# Patient Record
Sex: Female | Born: 1975 | Race: Black or African American | Hispanic: No | Marital: Married | Smoking: Never smoker
Health system: Southern US, Community
[De-identification: ages and names within clinical notes are randomized; demographics above are authoritative.]

## PROBLEM LIST (undated history)

## (undated) HISTORY — PX: WISDOM TOOTH EXTRACTION: SHX21

## (undated) HISTORY — PX: NO PAST SURGERIES: SHX2092

---

## 1999-12-22 ENCOUNTER — Other Ambulatory Visit: Admission: RE | Admit: 1999-12-22 | Discharge: 1999-12-22 | Payer: Self-pay | Admitting: Obstetrics and Gynecology

## 2000-01-19 ENCOUNTER — Encounter (INDEPENDENT_AMBULATORY_CARE_PROVIDER_SITE_OTHER): Payer: Self-pay

## 2000-01-19 ENCOUNTER — Other Ambulatory Visit: Admission: RE | Admit: 2000-01-19 | Discharge: 2000-01-19 | Payer: Self-pay | Admitting: Obstetrics and Gynecology

## 2001-03-10 ENCOUNTER — Other Ambulatory Visit: Admission: RE | Admit: 2001-03-10 | Discharge: 2001-03-10 | Payer: Self-pay | Admitting: Obstetrics and Gynecology

## 2006-07-26 ENCOUNTER — Encounter: Admission: RE | Admit: 2006-07-26 | Discharge: 2006-07-26 | Payer: Self-pay | Admitting: Obstetrics and Gynecology

## 2007-04-03 ENCOUNTER — Encounter: Admission: RE | Admit: 2007-04-03 | Discharge: 2007-04-03 | Payer: Self-pay | Admitting: Obstetrics and Gynecology

## 2008-02-09 ENCOUNTER — Encounter: Admission: RE | Admit: 2008-02-09 | Discharge: 2008-02-09 | Payer: Self-pay | Admitting: Obstetrics and Gynecology

## 2008-04-28 ENCOUNTER — Encounter (INDEPENDENT_AMBULATORY_CARE_PROVIDER_SITE_OTHER): Payer: Self-pay | Admitting: Obstetrics and Gynecology

## 2008-04-28 ENCOUNTER — Ambulatory Visit (HOSPITAL_COMMUNITY): Admission: RE | Admit: 2008-04-28 | Discharge: 2008-04-28 | Payer: Self-pay | Admitting: Obstetrics and Gynecology

## 2009-01-17 ENCOUNTER — Encounter: Admission: RE | Admit: 2009-01-17 | Discharge: 2009-01-17 | Payer: Self-pay | Admitting: Obstetrics and Gynecology

## 2010-01-31 ENCOUNTER — Encounter: Admission: RE | Admit: 2010-01-31 | Discharge: 2010-01-31 | Payer: Self-pay | Admitting: Obstetrics and Gynecology

## 2010-09-24 ENCOUNTER — Encounter: Payer: Self-pay | Admitting: Obstetrics and Gynecology

## 2011-01-16 NOTE — Op Note (Signed)
Sabrina Wilkinson, Sabrina Wilkinson            ACCOUNT NO.:  [id number]   MEDICAL RECORD NO.:  [id number]          PATIENT TYPE:  AMB   LOCATION:  SDC                           FACILITY:  [facility]   PHYSICIAN:  Randye Lobo, M.D.   DATE OF BIRTH:  Aug 04, 1976   DATE OF PROCEDURE:  04/28/2008  DATE OF DISCHARGE:                               OPERATIVE REPORT   PREOPERATIVE DIAGNOSES:  Displaced intrauterine device, abnormal uterine  bleeding.   POSTOPERATIVE DIAGNOSES:  Displaced intrauterine device, abnormal  uterine bleeding.   PROCEDURE:  Retrieval of IUD, hysteroscopy, and D&C.   SURGEON:  Randye Lobo, MD.   ANESTHESIA:  MAC, paracervical block with 1% lidocaine plain.   IV FLUIDS:  1700 mL Ringer's lactate.   ESTIMATED BLOOD LOSS:  Minimal.   URINE OUTPUT:  By I and O catheterization prior to procedure, sorbitol  deficit is 10 mL.   COMPLICATIONS:  None.   INDICATIONS FOR THE PROCEDURE:  The patient is a 35 year old gravida 2,  para 2 African American female who presented with irregular  menstruation.  The patient had an IUD placed in [place] approximately 2  years prior and had been doing well up until this point.  In previous  office visits, the IUD strings were not visible in the vagina and the  patient has had a prior ultrasound documenting the presence of the IUD  in the endometrial canal.  When the patient presented with bleeding  problems, again the IUD strings were not visible.  The patient did  undergo a Pap smear along with GC and chlamydia cultures, and these were  all normal.  An ultrasound was performed, which documented that the IUD  was in the upper cervical canal and not in the endometrial canal.  An  attempt was made to remove the IUD in an office setting, but this was  not possible.  Our plan is therefore now to proceed with the retrieval  of the IUD along with dilation of the cervix and a possible curettage of  the endometrium along with hysteroscopic assistance  if necessary.  Risks, benefits, and alternatives were reviewed with the patient who  wishes to proceed.  The patient has definitely requested IUD removal as  she wishes for another form of contraception.   SPECIMEN:  Endometrial curettings were sent to pathology.   PROCEDURE:  The patient was reidentified in the preoperative hold area.  She received Ancef 1 g IV for antibiotic prophylaxis.  The patient was  noted to have a negative pregnancy test.   In the operating room, the patient was placed in the supine position and  an LMA anesthetic was induced.  She was then placed in the dorsal  lithotomy position.  The vagina and perineum were sterilely prepped and  draped and the bladder was catheterized of urine.   An exam under anesthesia was performed.  The uterus was noted to be  small and anteverted and there were no adnexal masses.  On speculum  exam, no IUD strings were visible.   The uterus was sounded at this point to 7 cm.  The  cervix was then  sequentially dilated to a #21 Pratt dilator.  A polyp forceps was  introduced to the top of the cervical canal and the IUD could be grasped  at this time and was removed in entirety.  It was discarded.   Due to the patient's irregular bleeding, a decision was made to proceed  also with a hysteroscopy and endometrial curettage.  The diagnostic  hysteroscope was therefore inserted through the cervical os under the  continuous infusion of sorbitol solution.  The endometrial cavity was  visualized and the endometrium appeared to be somewhat thickened,  although there were no discrete polyps or fibroids appreciated.  The  hysteroscope was therefore withdrawn and gentle curettage in all 4  quadrants was performed with a serrated curette and the endometrial  curettings were sent to pathology.   All of the vaginal instruments were removed.  The hemostasis was noted  to be good.   The patient was cleansed with Betadine and escorted to the  recovery room  in stable and awake condition.  There were no complications to the  procedure.  All needle, instrument, and sponge counts were correct.      Randye Lobo, M.D.  Electronically Signed     BES/MEDQ  D:  04/28/2008  T:  04/29/2008  Job:  [id number]

## 2013-01-28 ENCOUNTER — Other Ambulatory Visit: Payer: Self-pay

## 2013-01-28 DIAGNOSIS — Z1231 Encounter for screening mammogram for malignant neoplasm of breast: Secondary | ICD-10-CM

## 2013-02-13 ENCOUNTER — Other Ambulatory Visit: Payer: Self-pay

## 2013-02-27 ENCOUNTER — Ambulatory Visit
Admission: RE | Admit: 2013-02-27 | Discharge: 2013-02-27 | Disposition: A | Discharge status: Home / Self Care | Payer: 59 | Source: Ambulatory Visit

## 2013-02-27 DIAGNOSIS — Z1231 Encounter for screening mammogram for malignant neoplasm of breast: Secondary | ICD-10-CM

## 2013-03-03 ENCOUNTER — Other Ambulatory Visit: Payer: Self-pay | Admitting: Obstetrics and Gynecology

## 2013-03-03 DIAGNOSIS — R928 Other abnormal and inconclusive findings on diagnostic imaging of breast: Secondary | ICD-10-CM

## 2013-03-17 ENCOUNTER — Ambulatory Visit
Admission: RE | Admit: 2013-03-17 | Discharge: 2013-03-17 | Disposition: A | Discharge status: Home / Self Care | Payer: 59 | Source: Ambulatory Visit | Attending: Obstetrics and Gynecology | Admitting: Obstetrics and Gynecology

## 2013-03-17 DIAGNOSIS — R928 Other abnormal and inconclusive findings on diagnostic imaging of breast: Secondary | ICD-10-CM

## 2014-02-10 ENCOUNTER — Other Ambulatory Visit: Payer: Self-pay

## 2014-02-10 DIAGNOSIS — Z1231 Encounter for screening mammogram for malignant neoplasm of breast: Secondary | ICD-10-CM

## 2014-03-12 ENCOUNTER — Ambulatory Visit
Admission: RE | Admit: 2014-03-12 | Discharge: 2014-03-12 | Disposition: A | Discharge status: Home / Self Care | Payer: Private Health Insurance - Indemnity | Source: Ambulatory Visit

## 2014-03-12 ENCOUNTER — Encounter (INDEPENDENT_AMBULATORY_CARE_PROVIDER_SITE_OTHER): Payer: Self-pay

## 2014-03-12 DIAGNOSIS — Z1231 Encounter for screening mammogram for malignant neoplasm of breast: Secondary | ICD-10-CM

## 2014-04-05 ENCOUNTER — Emergency Department (HOSPITAL_COMMUNITY): Payer: Private Health Insurance - Indemnity

## 2014-04-05 ENCOUNTER — Emergency Department (HOSPITAL_COMMUNITY)
Admission: EM | Admit: 2014-04-05 | Discharge: 2014-04-05 | Disposition: A | Discharge status: Home / Self Care | Payer: Private Health Insurance - Indemnity | Attending: Emergency Medicine | Admitting: Emergency Medicine

## 2014-04-05 ENCOUNTER — Encounter (HOSPITAL_COMMUNITY): Payer: Self-pay | Admitting: Emergency Medicine

## 2014-04-05 DIAGNOSIS — R42 Dizziness and giddiness: Secondary | ICD-10-CM | POA: Insufficient documentation

## 2014-04-05 DIAGNOSIS — H8190 Unspecified disorder of vestibular function, unspecified ear: Secondary | ICD-10-CM

## 2014-04-05 LAB — COMPREHENSIVE METABOLIC PANEL
ALT: 16 U/L (ref 0–35)
AST: 19 U/L (ref 0–37)
Albumin: 3.9 g/dL (ref 3.5–5.2)
Alkaline Phosphatase: 64 U/L (ref 39–117)
Anion gap: 14 (ref 5–15)
BUN: 13 mg/dL (ref 6–23)
CALCIUM: 9.2 mg/dL (ref 8.4–10.5)
CO2: 24 mEq/L (ref 19–32)
CREATININE: 0.79 mg/dL (ref 0.50–1.10)
Chloride: 102 mEq/L (ref 96–112)
GFR calc Af Amer: >90 mL/min (ref >90)
GLUCOSE: 117 mg/dL — AB (ref 70–99)
Potassium: 3.6 mEq/L — ABNORMAL LOW (ref 3.7–5.3)
Sodium: 140 mEq/L (ref 137–147)
Total Bilirubin: 0.3 mg/dL (ref 0.3–1.2)
Total Protein: 7.7 g/dL (ref 6.0–8.3)

## 2014-04-05 LAB — CBC
HEMATOCRIT: 43.7 % (ref 36.0–46.0)
Hemoglobin: 14.5 g/dL (ref 12.0–15.0)
MCH: 31.6 pg (ref 26.0–34.0)
MCHC: 33.2 g/dL (ref 30.0–36.0)
MCV: 95.2 fL (ref 78.0–100.0)
Platelets: 271 10*3/uL (ref 150–400)
RBC: 4.59 MIL/uL (ref 3.87–5.11)
RDW: 13.8 % (ref 11.5–15.5)
WBC: 7 10*3/uL (ref 4.0–10.5)

## 2014-04-05 LAB — DIFFERENTIAL
Basophils Absolute: 0 10*3/uL (ref 0.0–0.1)
Basophils Relative: 0 % (ref 0–1)
EOS PCT: 1 % (ref 0–5)
Eosinophils Absolute: 0 10*3/uL (ref 0.0–0.7)
LYMPHS ABS: 1.6 10*3/uL (ref 0.7–4.0)
Lymphocytes Relative: 23 % (ref 12–46)
MONO ABS: 0.3 10*3/uL (ref 0.1–1.0)
Monocytes Relative: 5 % (ref 3–12)
Neutro Abs: 5 10*3/uL (ref 1.7–7.7)
Neutrophils Relative %: 71 % (ref 43–77)

## 2014-04-05 LAB — I-STAT TROPONIN, ED: TROPONIN I, POC: 0 ng/mL (ref 0.00–0.08)

## 2014-04-05 LAB — I-STAT CHEM 8, ED
BUN: 13 mg/dL (ref 6–23)
CALCIUM ION: 1.15 mmol/L (ref 1.12–1.23)
Chloride: 106 mEq/L (ref 96–112)
Creatinine, Ser: 0.8 mg/dL (ref 0.50–1.10)
Glucose, Bld: 120 mg/dL — ABNORMAL HIGH (ref 70–99)
HCT: 49 % — ABNORMAL HIGH (ref 36.0–46.0)
Hemoglobin: 16.7 g/dL — ABNORMAL HIGH (ref 12.0–15.0)
Potassium: 3.6 mEq/L — ABNORMAL LOW (ref 3.7–5.3)
Sodium: 140 mEq/L (ref 137–147)
TCO2: 24 mmol/L (ref 0–100)

## 2014-04-05 LAB — PROTIME-INR
INR: 1.01 (ref 0.00–1.49)
Prothrombin Time: 13.3 seconds (ref 11.6–15.2)

## 2014-04-05 LAB — APTT: aPTT: 31 seconds (ref 24–37)

## 2014-04-05 MED ORDER — MECLIZINE HCL 50 MG PO TABS: 50.0000 mg | ORAL_TABLET | Freq: Three times a day (TID) | ORAL | Status: DC | PRN

## 2014-04-05 NOTE — ED Provider Notes (Signed)
CSN: [id number]     Arrival date & time 04/05/14  0615 History   First MD Initiated Contact with Patient 04/05/14 [phone]     Chief Complaint  Patient presents with  . Dizziness  . Nausea     (Consider location/radiation/quality/duration/timing/severity/associated sxs/prior Treatment) HPI  Sabrina Wilkinson is a 38 y.o. female here for evaluation of intermittent episodes of vertigo. She describes a spinning sensation that causes her to feel off balance, which occurs spontaneously, and resolves within a few seconds; onset symptoms yesterday. There is no associated headache, weakness, paresthesias, fever, chills, cough, chest pain or shortness of breath. She has been having some mild sneezing recently. She's never had this previously. There are no other known modifying factors.   History reviewed. No pertinent past medical history. History reviewed. No pertinent past surgical history. No family history on file. History  Substance Use Topics  . Smoking status: Never Smoker   . Smokeless tobacco: Not on file  . Alcohol Use: Yes   OB History   Grav Para Term Preterm Abortions TAB SAB Ect Mult Living                 Review of Systems  All other systems reviewed and are negative.     Allergies  Review of patient's allergies indicates no known allergies.  Home Medications   Prior to Admission medications   Not on File   BP 149/93  Pulse 74  Temp(Src) 97.7 F (36.5 C) (Oral)  Resp 18  SpO2 100%  LMP 04/04/2014 Physical Exam  Nursing note and vitals reviewed. Constitutional: She is oriented to person, place, and time. She appears well-developed and well-nourished.  HENT:  Head: Normocephalic and atraumatic.  Eyes: Conjunctivae and EOM are normal. Pupils are equal, round, and reactive to light.  Neck: Normal range of motion and phonation normal. Neck supple.  Cardiovascular: Normal rate, regular rhythm and intact distal pulses.   Pulmonary/Chest: Effort normal and breath  sounds normal. She exhibits no tenderness.  Abdominal: Soft. She exhibits no distension. There is no tenderness. There is no guarding.  Musculoskeletal: Normal range of motion.  Neurological: She is alert and oriented to person, place, and time. She exhibits normal muscle tone.  No dysarthria, aphasia or nystagmus  Skin: Skin is warm and dry.  Psychiatric: She has a normal mood and affect. Her behavior is normal. Judgment and thought content normal.    ED Course  Procedures (including critical care time)   Findings discussed with patient and husband. All questions answered.   Labs Review Labs Reviewed  I-STAT CHEM 8, ED - Abnormal; Notable for the following:    Potassium 3.6 (*)    Glucose, Bld 120 (*)    Hemoglobin 16.7 (*)    HCT 49.0 (*)    All other components within normal limits  PROTIME-INR  APTT  CBC  DIFFERENTIAL  COMPREHENSIVE METABOLIC PANEL  I-STAT TROPOININ, ED  POC URINE PREG, ED    Imaging Review Ct Head (brain) Wo Contrast  04/05/2014   CLINICAL DATA:  Dizziness started 2 p.m. yesterday. Nausea. No headache or body weakness.  EXAM: CT HEAD WITHOUT CONTRAST  TECHNIQUE: Contiguous axial images were obtained from the base of the skull through the vertex without intravenous contrast.  COMPARISON:  None.  FINDINGS: No intracranial hemorrhage.  No hydrocephalus.  No intracranial mass lesion noted on this unenhanced exam.  Vessel at the M1 segment of the right middle cerebral artery may represent vein rather than hyperdense  right middle cerebral artery. If right hemispheric infarct is of high clinical concern, MR imaging may prove helpful for further delineation.  No CT evidence of posterior fossa infarct however given the patient's symptoms, if this were of high clinical concern, MR would prove more sensitive for detection of small acute posterior fossa infarct.  Mastoid air cells, middle ear cavities and visualized paranasal sinuses are clear. No osseous destructive  lesion.  IMPRESSION: No intracranial hemorrhage.  No definitive CT evidence of large acute infarct. Follow-up as discussed above.  These results were called by telephone at the time of interpretation on 04/05/2014 at 7:32 am to Dr. Nicanor AlconPalumbo, who verbally acknowledged these results.   Electronically Signed   By: Bridgett LarssonSteve  Olson M.D.   On: 04/05/2014 07:33     EKG Interpretation   Date/Time:  Monday April 05 2014 06:27:53 EDT Ventricular Rate:  65 PR Interval:  156 QRS Duration: 80 QT Interval:  410 QTC Calculation: 426 R Axis:   35 Text Interpretation:  Normal sinus rhythm Nonspecific T wave abnormality  Abnormal ECG No old tracing to compare Confirmed by [facility]  MD, Ilze Roselli  [phone]) on 04/05/2014 7:33:58 AM      MDM   Final diagnoses:  Vestibular dizziness, unspecified laterality    Evidence of vertigo from likely middle ear source. No evidence for central vertigo.   Nursing Notes Reviewed/ Care Coordinated Applicable Imaging Reviewed Interpretation of Laboratory Data incorporated into ED treatment  The patient appears reasonably screened and/or stabilized for discharge and I doubt any other medical condition or other [facility] requiring further screening, evaluation, or treatment in the ED at this time prior to discharge.  Plan: Home Medications- Meclizine; Home Treatments- rest; return here if the recommended treatment, does not improve the symptoms; Recommended follow up- PCP prn    Flint MelterElliott L Jaclyn Carew, MD 04/05/14 1701

## 2014-04-05 NOTE — ED Notes (Signed)
Reports sudden onset of dizziness yesterday that was followed by nausea and vomiting.  Reports vomited x 3.

## 2014-04-05 NOTE — ED Notes (Signed)
Patient transported to CT 

## 2014-04-05 NOTE — Discharge Instructions (Signed)

## 2015-06-07 ENCOUNTER — Encounter (HOSPITAL_COMMUNITY): Payer: Self-pay | Admitting: Emergency Medicine

## 2015-06-07 DIAGNOSIS — R109 Unspecified abdominal pain: Secondary | ICD-10-CM | POA: Diagnosis present

## 2015-06-07 DIAGNOSIS — R1013 Epigastric pain: Secondary | ICD-10-CM | POA: Insufficient documentation

## 2015-06-07 LAB — COMPREHENSIVE METABOLIC PANEL
ALK PHOS: 62 U/L (ref 38–126)
ALT: 29 U/L (ref 14–54)
AST: 67 U/L — ABNORMAL HIGH (ref 15–41)
Albumin: 3.9 g/dL (ref 3.5–5.0)
Anion gap: 9 (ref 5–15)
BUN: 11 mg/dL (ref 6–20)
CHLORIDE: 106 mmol/L (ref 101–111)
CO2: 22 mmol/L (ref 22–32)
CREATININE: 0.74 mg/dL (ref 0.44–1.00)
Calcium: 9.2 mg/dL (ref 8.9–10.3)
Glucose, Bld: 113 mg/dL — ABNORMAL HIGH (ref 65–99)
Potassium: 3.5 mmol/L (ref 3.5–5.1)
SODIUM: 137 mmol/L (ref 135–145)
Total Bilirubin: 0.8 mg/dL (ref 0.3–1.2)
Total Protein: 7.3 g/dL (ref 6.5–8.1)

## 2015-06-07 LAB — CBC WITH DIFFERENTIAL/PLATELET
Basophils Absolute: 0 10*3/uL (ref 0.0–0.1)
Basophils Relative: 0 %
Eosinophils Absolute: 0.1 10*3/uL (ref 0.0–0.7)
Eosinophils Relative: 1 %
HCT: 42.3 % (ref 36.0–46.0)
HEMOGLOBIN: 13.8 g/dL (ref 12.0–15.0)
LYMPHS ABS: 4.1 10*3/uL — AB (ref 0.7–4.0)
LYMPHS PCT: 40 %
MCH: 31.2 pg (ref 26.0–34.0)
MCHC: 32.6 g/dL (ref 30.0–36.0)
MCV: 95.5 fL (ref 78.0–100.0)
Monocytes Absolute: 0.7 10*3/uL (ref 0.1–1.0)
Monocytes Relative: 7 %
NEUTROS PCT: 52 %
Neutro Abs: 5.4 10*3/uL (ref 1.7–7.7)
Platelets: 289 10*3/uL (ref 150–400)
RBC: 4.43 MIL/uL (ref 3.87–5.11)
RDW: 13.2 % (ref 11.5–15.5)
WBC: 10.4 10*3/uL (ref 4.0–10.5)

## 2015-06-07 NOTE — ED Notes (Signed)
Pt c/o epigastric pain onset approx 1 hour ago.  Pt denies any nausea or vomiting.  Pt st's she had pizza for dinner. St's she tried tums without relief

## 2015-06-08 ENCOUNTER — Emergency Department (HOSPITAL_COMMUNITY)
Admission: EM | Admit: 2015-06-08 | Discharge: 2015-06-08 | Discharge status: Against Medical Advice | Payer: 59 | Attending: Emergency Medicine | Admitting: Emergency Medicine

## 2015-06-08 NOTE — ED Notes (Signed)
Unable to locate for room x3 

## 2015-06-19 IMAGING — MG MM SCREEN MAMMOGRAM BILATERAL
4 series · 4 of 4 positions shown · non-contrast
Comparison: Previous exam(s).

CLINICAL DATA: Screening.

EXAM:
DIGITAL SCREENING BILATERAL MAMMOGRAM WITH CAD

[R CC]
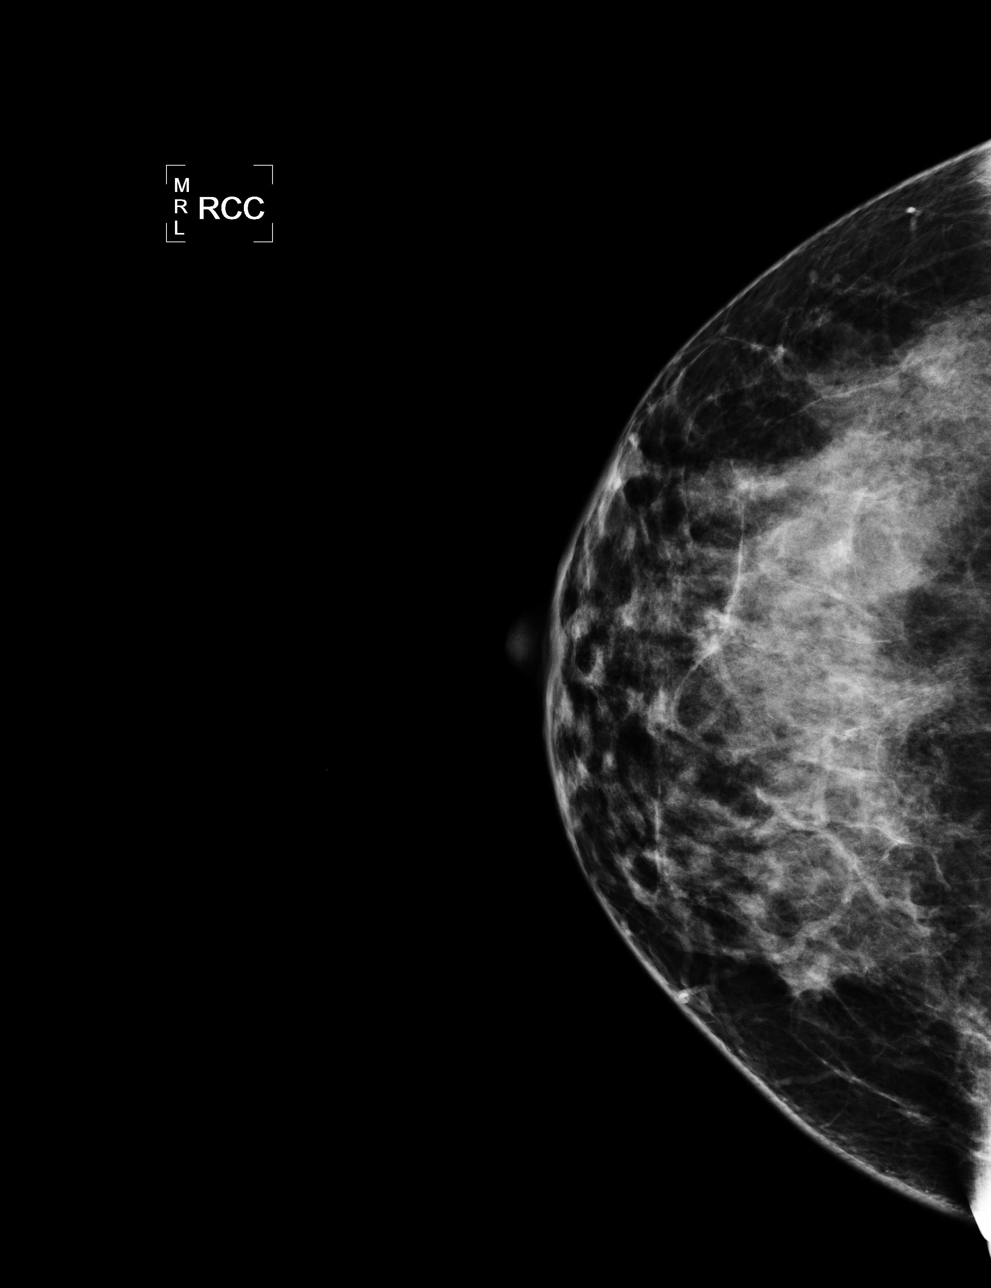

[L CC]
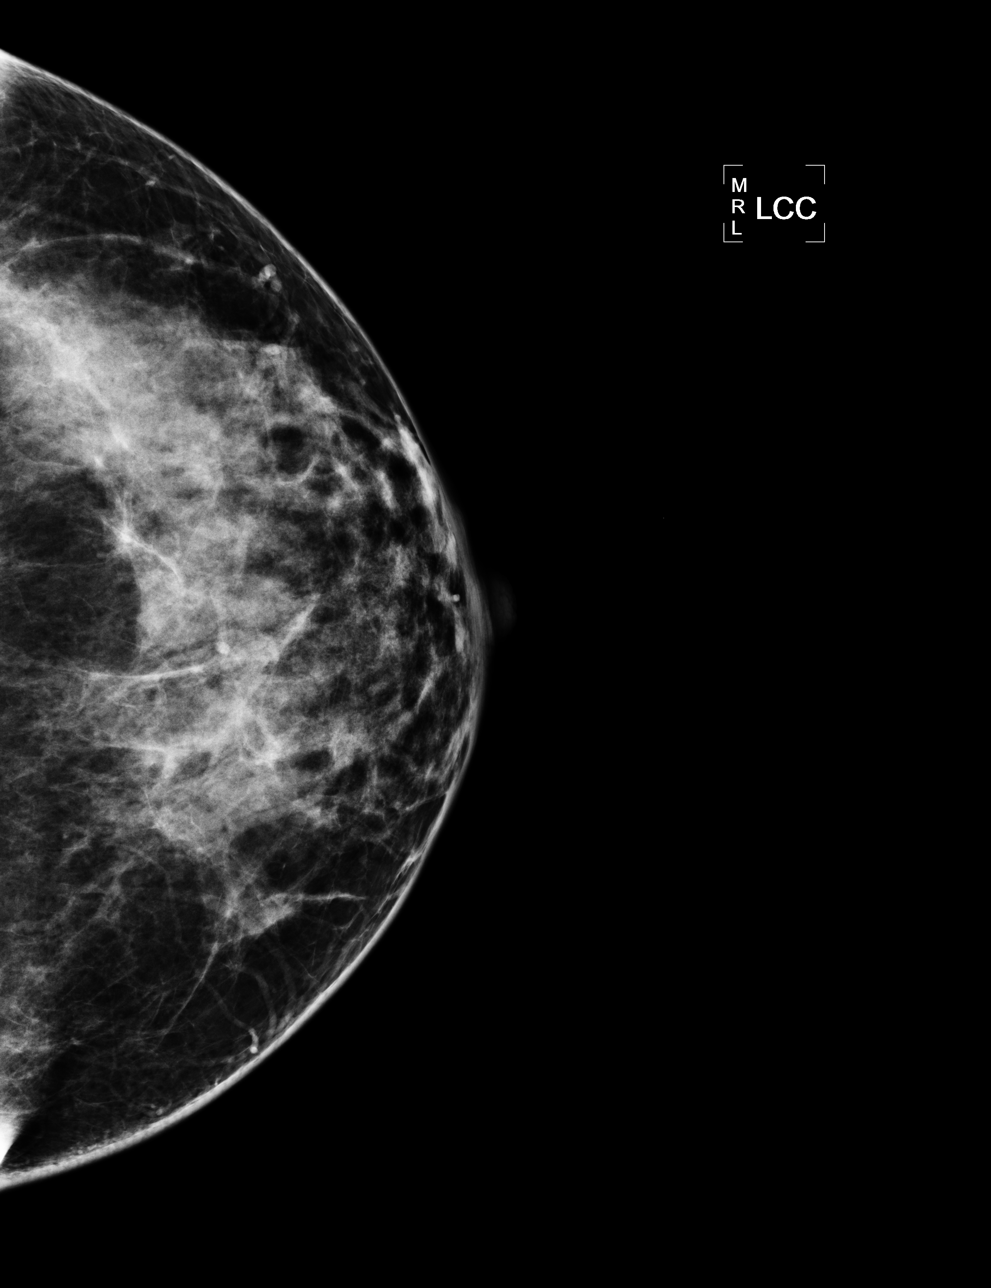

[L MLO]
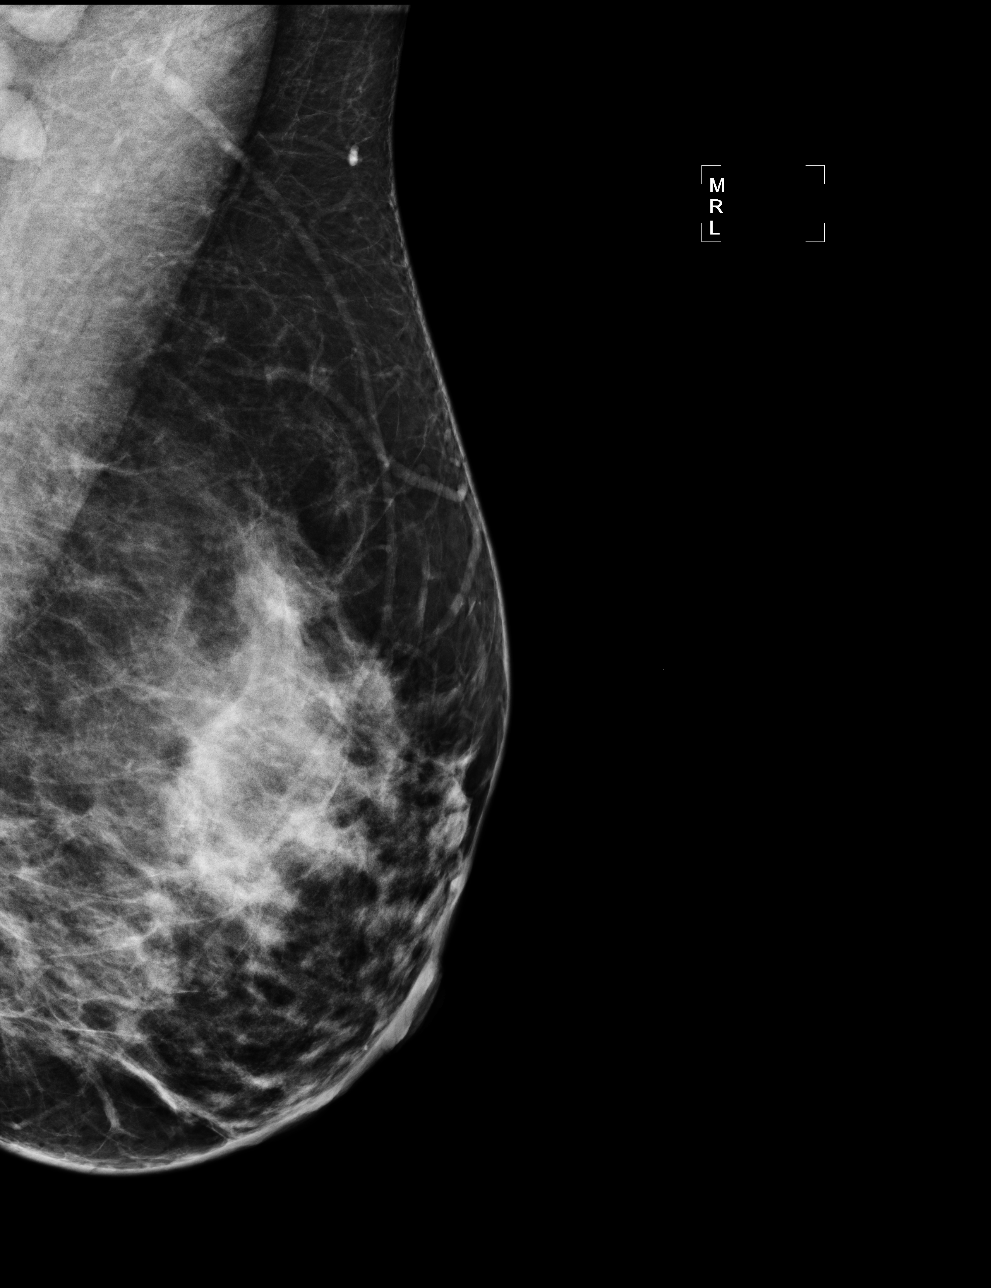

[R MLO]
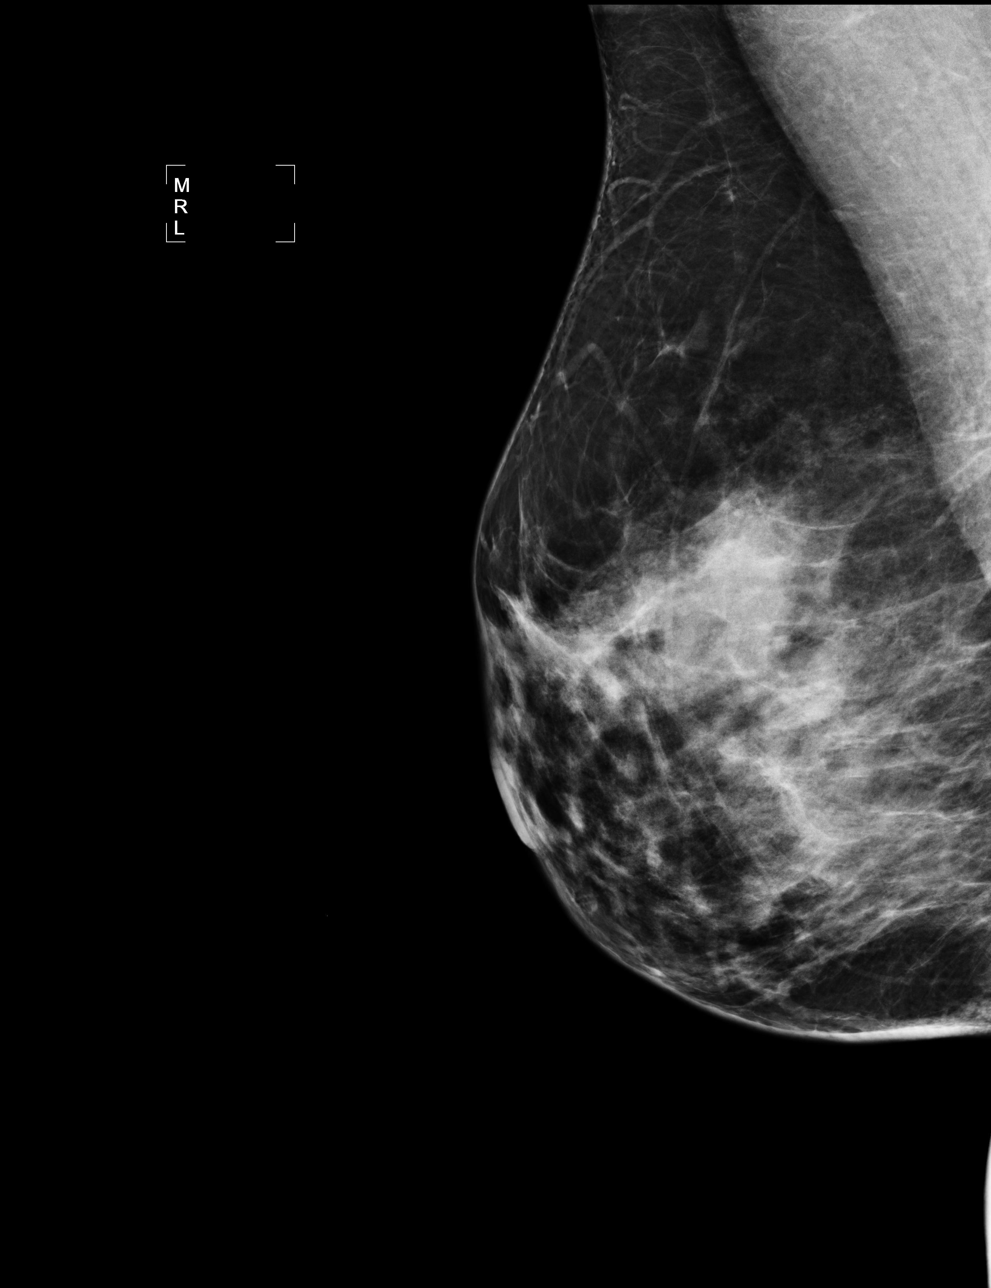

[4 of 4 positions shown; findings below may reference images not displayed]

ACR Breast Density Category c: The breast tissue is heterogeneously
dense, which may obscure small masses.
FINDINGS: There are no findings suspicious for malignancy. Images were
processed with CAD.
IMPRESSION: No mammographic evidence of malignancy. A result letter of this
screening mammogram will be mailed directly to the patient.

RECOMMENDATION:
Screening mammogram in one year. (Code:YJ-2-FEZ)

BI-RADS CATEGORY  1: Negative.

## 2015-07-13 IMAGING — CT CT HEAD W/O CM
1 series · 15 of 30 positions shown, 19 images · non-contrast
Comparison: None.

CLINICAL DATA: Dizziness started 2 p.m. yesterday. Nausea. No
headache or body weakness.

EXAM:
CT HEAD WITHOUT CONTRAST
TECHNIQUE: Contiguous axial images were obtained from the base of the skull
through the vertex without intravenous contrast.

[Series 2: head 5.0 h30s · axial · 0.46mm/px · z∈[-84,+51]mm · 15 of 30 slices shown, 19 images]
[im 2/30  brain]
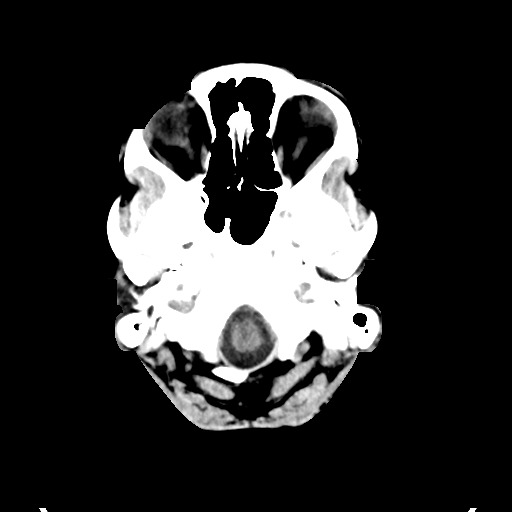
[im 2/30  bone]
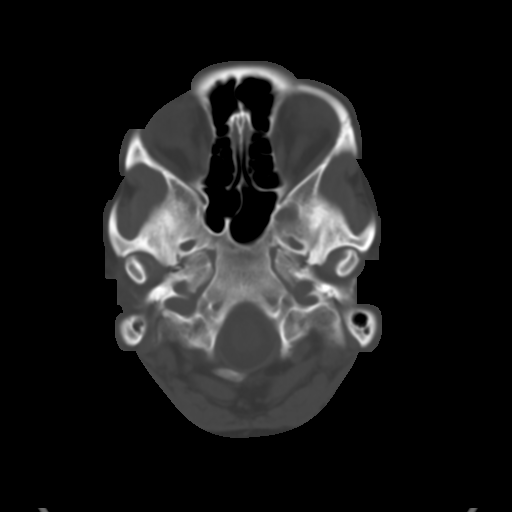
[im 4/30  brain]
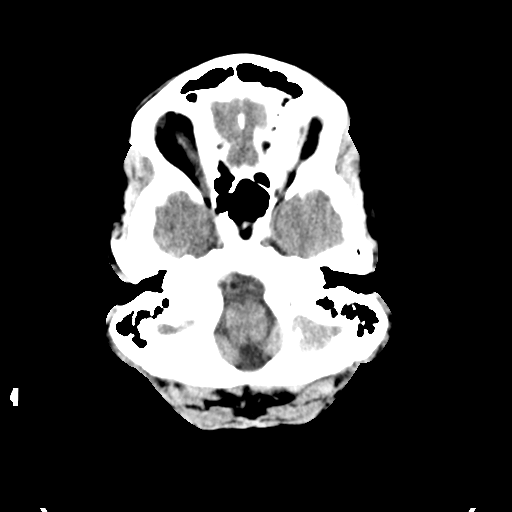
[im 6/30  brain]
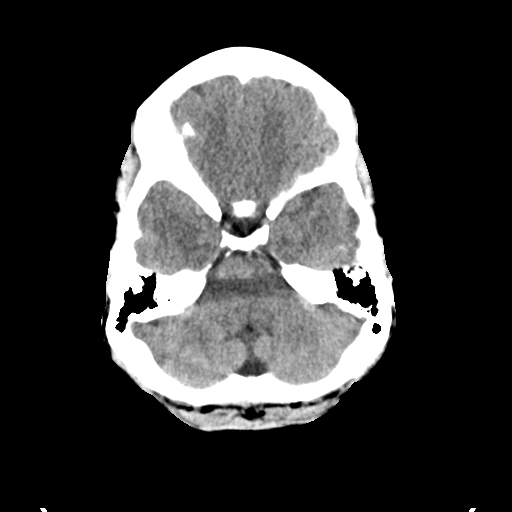
[im 8/30  brain]
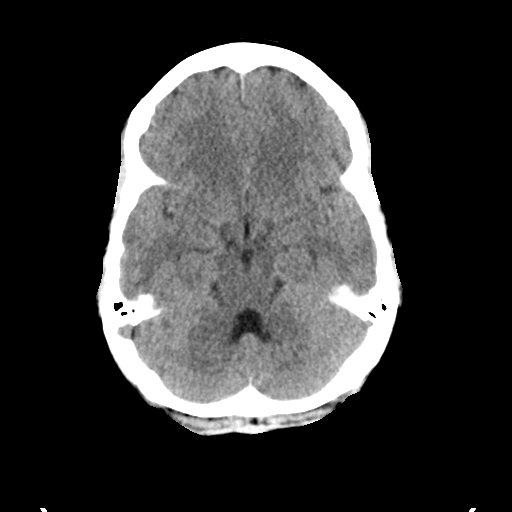
[im 10/30  brain]
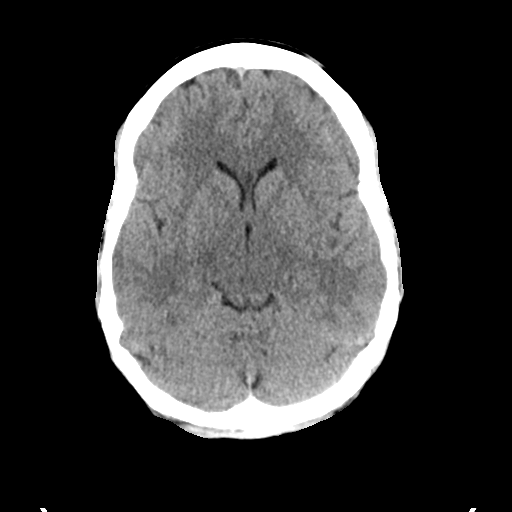
[im 10/30  bone]
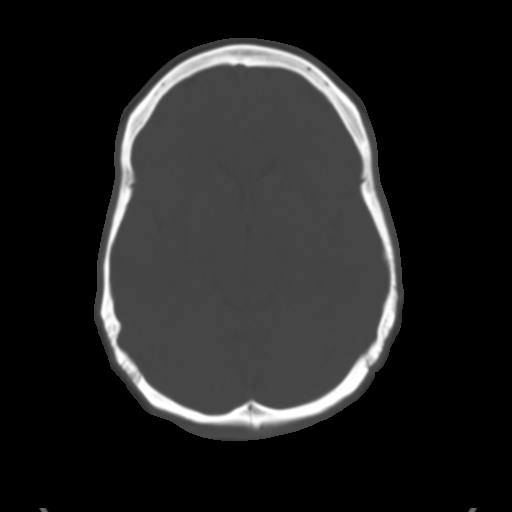
[im 12/30  brain]
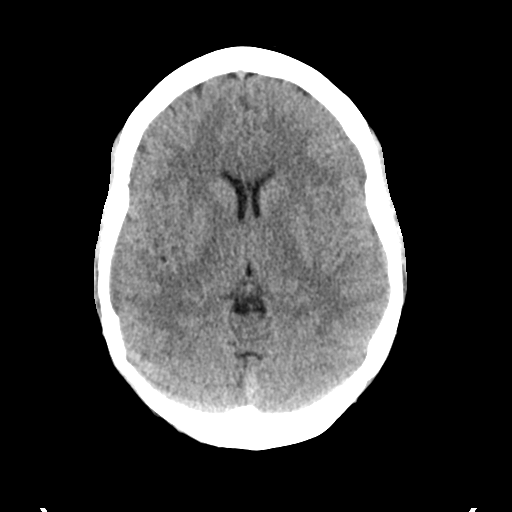
[im 14/30  brain]
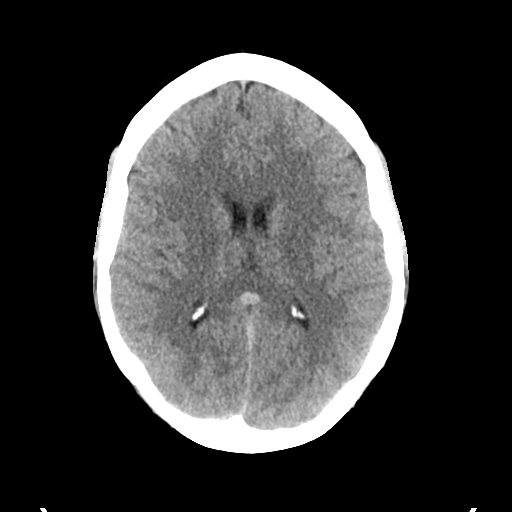
[im 16/30  brain]
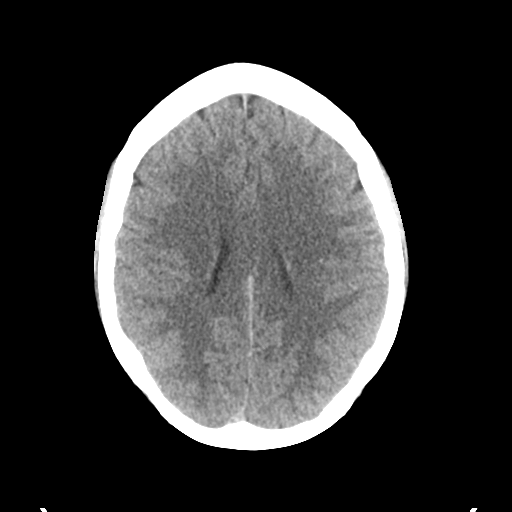
[im 17/30  brain]
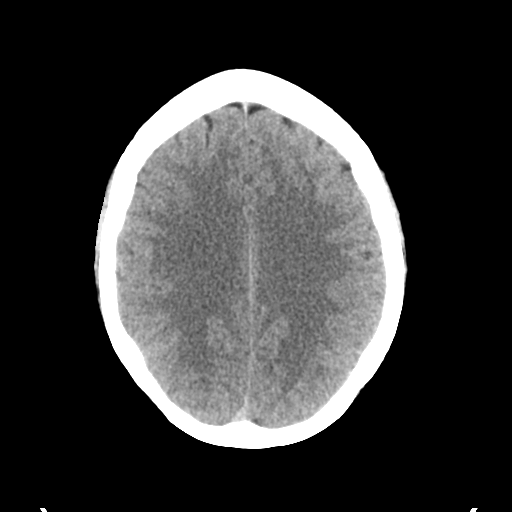
[im 17/30  bone]
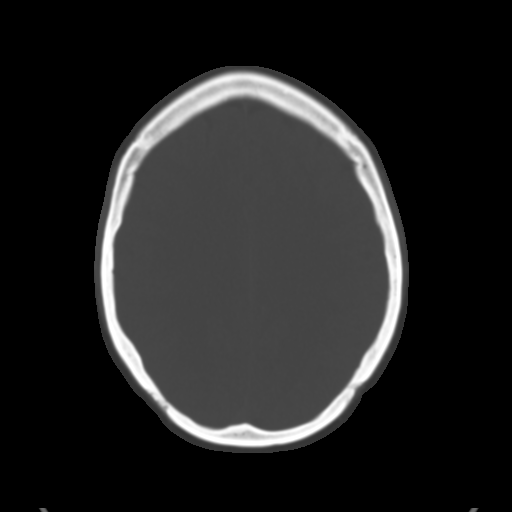
[im 19/30  brain]
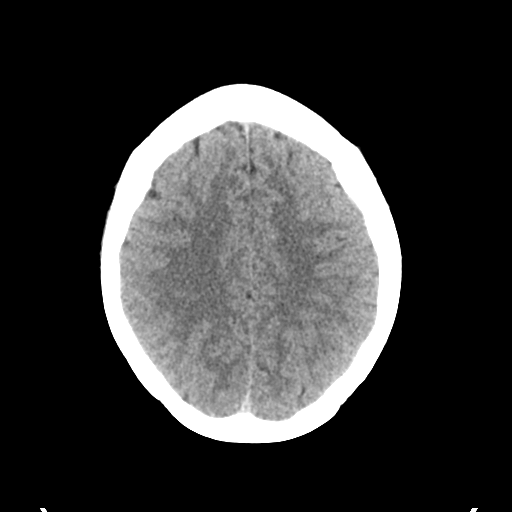
[im 21/30  brain]
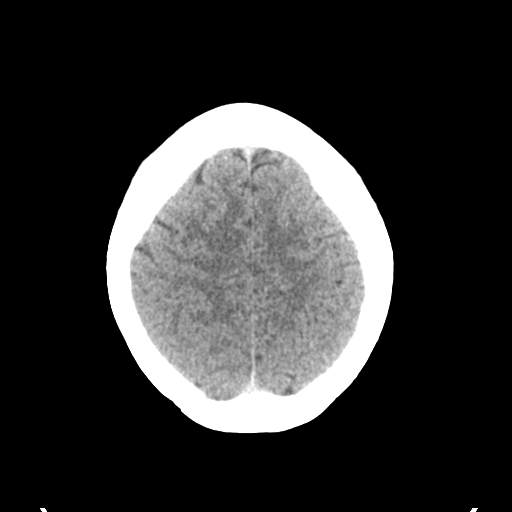
[im 23/30  brain]
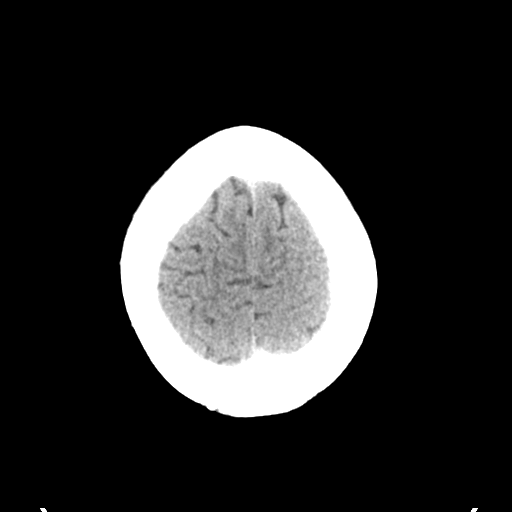
[im 25/30  brain]
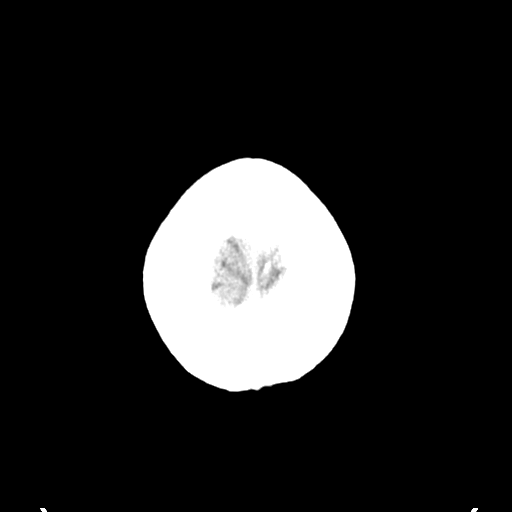
[im 25/30  bone]
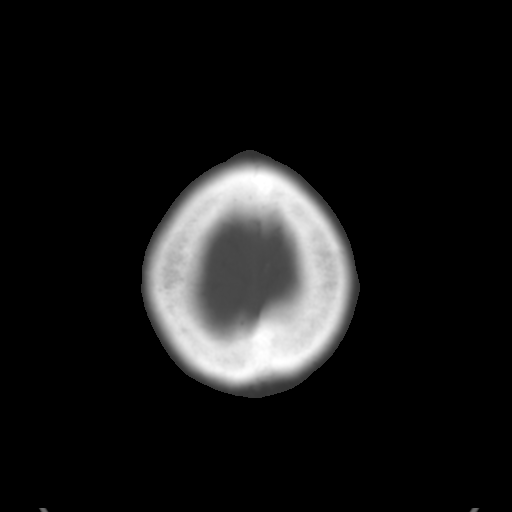
[im 27/30  brain]
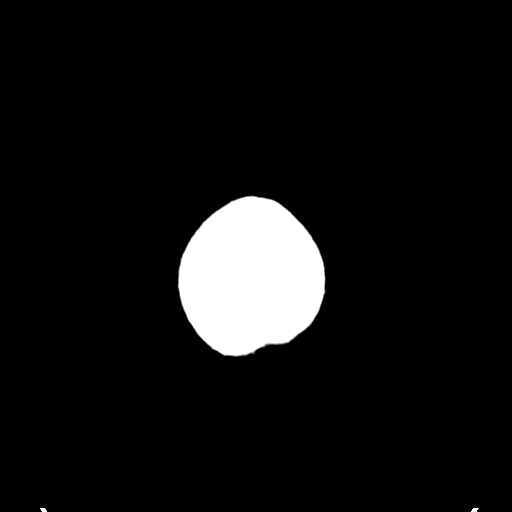
[im 29/30  brain]
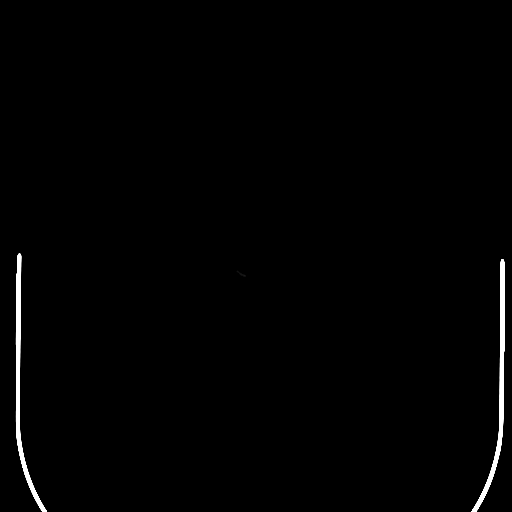

[15 of 30 positions shown; findings below may reference images not displayed]

FINDINGS: No intracranial hemorrhage.

No hydrocephalus.

No intracranial mass lesion noted on this unenhanced exam.

Vessel at the M1 segment of the right middle cerebral artery may
represent vein rather than hyperdense right middle cerebral artery.
If right hemispheric infarct is of high clinical concern, MR imaging
may prove helpful for further delineation.

No CT evidence of posterior fossa infarct however given the
patient's symptoms, if this were of high clinical concern, MR would
prove more sensitive for detection of small acute posterior fossa
infarct.

Mastoid air cells, middle ear cavities and visualized paranasal
sinuses are clear. No osseous destructive lesion.
IMPRESSION: No intracranial hemorrhage.

No definitive CT evidence of large acute infarct. Follow-up as
discussed above.

These results were called by telephone at the time of interpretation
on 04/05/2014 at [DATE] to Dr. Restaurang, who verbally acknowledged
these results.

## 2016-02-17 ENCOUNTER — Ambulatory Visit (INDEPENDENT_AMBULATORY_CARE_PROVIDER_SITE_OTHER): Payer: 59 | Admitting: Internal Medicine

## 2016-02-17 ENCOUNTER — Encounter: Payer: Self-pay | Admitting: Internal Medicine

## 2016-02-17 VITALS — BP 120/88 | HR 79 | Temp 99.1°F | Resp 16 | Ht 64.0 in | Wt 180.0 lb

## 2016-02-17 DIAGNOSIS — Z23 Encounter for immunization: Secondary | ICD-10-CM

## 2016-02-17 DIAGNOSIS — Z0000 Encounter for general adult medical examination without abnormal findings: Secondary | ICD-10-CM

## 2016-02-17 DIAGNOSIS — R1012 Left upper quadrant pain: Secondary | ICD-10-CM | POA: Diagnosis not present

## 2016-02-17 DIAGNOSIS — L509 Urticaria, unspecified: Secondary | ICD-10-CM

## 2016-02-17 NOTE — Patient Instructions (Addendum)
  Test(s) ordered today. Your results will be released to MyChart (or called to you) after review, usually within 72hours after test completion. If any changes need to be made, you will be notified at that same time.  All other Health Maintenance issues reviewed.   All recommended immunizations and age-appropriate screenings are up-to-date or discussed.  Tetanus (tdap) vaccine administered today.   Medications reviewed and updated.  No changes recommended at this time.  An ultrasound was ordered - we will call you to schedule this.   A referral was ordered for allergy.   Please followup in one year for a physical

## 2016-02-17 NOTE — Progress Notes (Signed)
Pre visit review using our clinic review tool, if applicable. No additional management support is needed unless otherwise documented below in the visit note. 

## 2016-02-17 NOTE — Progress Notes (Signed)
Subjective:    Patient ID: Sabrina Wilkinson, female    DOB: 03-14-1976, 40 y.o.   MRN: [id number]  HPI She is here to establish with a new pcp.    Pain or tightness in left upper abdomen/lower chest:  She has had a few episodes of this pain that would radiate to the back.  This occurred with pizza, steak or more fatty foods or if she laid down after eating.  She has just started to avoid these foods and she has not had it in a while.  The pain can last all day or several hours.  She denies heartburn. She would have some nausea.  Antacids have not helped.  Hives:  She has occasional hives.  Sometimes it occurs with stress or balsalmic vinegar or pasta sauce - she is unsure if it is related to those foods or not.  She usually does not take anything.  She avoids those foods.  She has not needed to take anything.  She has had lip swelling on at least one occasion.  Medications and allergies reviewed with patient and updated if appropriate.  Patient Active Problem List   Diagnosis Date Noted  . Hives 02/17/2016  . LUQ pain 02/17/2016    No current outpatient prescriptions on file prior to visit.   No current facility-administered medications on file prior to visit.    History reviewed. No pertinent past medical history.  Past Surgical History  Procedure Laterality Date  . No past surgeries      Social History   Social History  . Marital Status: Single    Spouse Name: N/A  . Number of Children: N/A  . Years of Education: N/A   Social History Main Topics  . Smoking status: Never Smoker   . Smokeless tobacco: None  . Alcohol Use: No  . Drug Use: No  . Sexual Activity: Not Asked   Other Topics Concern  . None   Social History Narrative   Married, 2 kids   Works: Designer, jewellery   Exercise: none    Family History  Problem Relation Age of Onset  . Cancer Mother     Breast  . Cancer Maternal Aunt   . Cancer Maternal Grandmother     Review of Systems    Constitutional: Negative for fever, chills, appetite change and unexpected weight change.  Eyes: Negative for visual disturbance.  Respiratory: Negative for cough, shortness of breath and wheezing.   Cardiovascular: Negative for chest pain, palpitations and leg swelling.  Gastrointestinal: Positive for abdominal pain. Negative for diarrhea, constipation and blood in stool.       No GERD  Genitourinary: Negative for dysuria and hematuria.  Musculoskeletal: Negative for arthralgias.  Neurological: Negative for dizziness, light-headedness and headaches.  Psychiatric/Behavioral: Negative for dysphoric mood. The patient is not nervous/anxious.        Objective:   Filed Vitals:   02/17/16 0947  BP: 120/88  Pulse: 79  Temp: 99.1 F (37.3 C)  Resp: 16   Filed Weights   02/17/16 0947  Weight: 180 lb (81.647 kg)   Body mass index is 30.88 kg/(m^2).   Physical Exam Constitutional: She appears well-developed and well-nourished. No distress.  HENT:  Head: Normocephalic and atraumatic.  Right Ear: External ear normal. Normal ear canal and TM Left Ear: External ear normal.  Normal ear canal and TM Mouth/Throat: Oropharynx is clear and moist.  Eyes: Conjunctivae and EOM are normal.  Neck: Neck supple. No tracheal deviation  present. No thyromegaly present.  No carotid bruit  Cardiovascular: Normal rate, regular rhythm and normal heart sounds.   No murmur heard.  No edema. Pulmonary/Chest: Effort normal and breath sounds normal. No respiratory distress. She has no wheezes. She has no rales.  Abdominal: Soft. She exhibits no distension. There is no tenderness.  Lymphadenopathy: She has no cervical adenopathy.  Skin: Skin is warm and dry. She is not diaphoretic.  Psychiatric: She has a normal mood and affect. Her behavior is normal.         Assessment & Plan:   See Problem List for Assessment and Plan of chronic medical problems.  Screening blood work ordered  Follow-up  annually, sooner if needed

## 2016-02-17 NOTE — Assessment & Plan Note (Signed)
?   Related to food, stress Would like to see an allergist-referred today Advised taking an antihistamine if needed Monitor closely for any lip or throat swelling and in that case she will need an EpiPen, but will hold off for now

## 2016-02-17 NOTE — Assessment & Plan Note (Signed)
Recurrent episodes typically related to fatty or rich foods Rule out gallbladder disease-ultrasound ordered Less likely atypical GERD-she did take antacids and it did not help Discussed that gallstones does not mean that is the cause Depending on ultrasound results and her symptoms consider surgery referral, but ultimately would like to avoid surgery if it is not necessary

## 2016-03-19 ENCOUNTER — Other Ambulatory Visit: Payer: 59

## 2016-03-26 ENCOUNTER — Inpatient Hospital Stay: Admission: RE | Admit: 2016-03-26 | Payer: 59 | Source: Ambulatory Visit

## 2016-04-02 ENCOUNTER — Ambulatory Visit: Payer: Self-pay | Admitting: Allergy and Immunology

## 2017-12-04 ENCOUNTER — Ambulatory Visit: Payer: 59 | Admitting: Family Medicine

## 2018-07-25 ENCOUNTER — Institutional Professional Consult (permissible substitution): Payer: 59 | Admitting: Family Medicine

## 2019-08-03 ENCOUNTER — Other Ambulatory Visit: Payer: Self-pay

## 2019-08-03 DIAGNOSIS — Z20822 Contact with and (suspected) exposure to covid-19: Secondary | ICD-10-CM

## 2019-08-04 LAB — NOVEL CORONAVIRUS, NAA: SARS-CoV-2, NAA: NOT DETECTED

## 2019-11-13 ENCOUNTER — Ambulatory Visit: Payer: Self-pay

## 2020-10-05 LAB — HM PAP SMEAR: HM Pap smear: NEGATIVE

## 2020-10-20 ENCOUNTER — Encounter: Payer: Self-pay | Admitting: Internal Medicine

## 2020-10-20 NOTE — Progress Notes (Signed)
Outside notes received. Information abstracted. Notes sent to scan.  

## 2020-11-08 LAB — HM MAMMOGRAPHY

## 2020-11-29 ENCOUNTER — Ambulatory Visit: Payer: Self-pay | Admitting: Internal Medicine

## 2021-02-16 ENCOUNTER — Encounter: Payer: Self-pay | Admitting: Internal Medicine

## 2021-02-16 ENCOUNTER — Ambulatory Visit: Payer: 59 | Admitting: Internal Medicine

## 2021-02-16 ENCOUNTER — Other Ambulatory Visit: Payer: Self-pay

## 2021-02-16 VITALS — BP 146/82 | HR 93 | Temp 98.4°F | Resp 16 | Ht 64.0 in | Wt 183.0 lb

## 2021-02-16 DIAGNOSIS — Z0001 Encounter for general adult medical examination with abnormal findings: Secondary | ICD-10-CM | POA: Insufficient documentation

## 2021-02-16 DIAGNOSIS — R5382 Chronic fatigue, unspecified: Secondary | ICD-10-CM | POA: Diagnosis not present

## 2021-02-16 DIAGNOSIS — Z1159 Encounter for screening for other viral diseases: Secondary | ICD-10-CM | POA: Insufficient documentation

## 2021-02-16 DIAGNOSIS — I10 Essential (primary) hypertension: Secondary | ICD-10-CM | POA: Diagnosis not present

## 2021-02-16 DIAGNOSIS — E559 Vitamin D deficiency, unspecified: Secondary | ICD-10-CM

## 2021-02-16 DIAGNOSIS — Z1211 Encounter for screening for malignant neoplasm of colon: Secondary | ICD-10-CM

## 2021-02-16 LAB — URINALYSIS, ROUTINE W REFLEX MICROSCOPIC
Bilirubin Urine: NEGATIVE
Ketones, ur: NEGATIVE
Nitrite: NEGATIVE
Specific Gravity, Urine: <=1.005 — AB (ref 1.000–1.030)
Total Protein, Urine: NEGATIVE
Urine Glucose: NEGATIVE
Urobilinogen, UA: 0.2 (ref 0.0–1.0)
pH: 6.5 (ref 5.0–8.0)

## 2021-02-16 LAB — BASIC METABOLIC PANEL
BUN: 13 mg/dL (ref 6–23)
CO2: 28 mEq/L (ref 19–32)
Calcium: 9.5 mg/dL (ref 8.4–10.5)
Chloride: 102 mEq/L (ref 96–112)
Creatinine, Ser: 0.75 mg/dL (ref 0.40–1.20)
GFR: 95.65 mL/min (ref >60.00)
Glucose, Bld: 95 mg/dL (ref 70–99)
Potassium: 3.7 mEq/L (ref 3.5–5.1)
Sodium: 138 mEq/L (ref 135–145)

## 2021-02-16 LAB — CBC WITH DIFFERENTIAL/PLATELET
Basophils Absolute: 0 10*3/uL (ref 0.0–0.1)
Basophils Relative: 0.6 % (ref 0.0–3.0)
Eosinophils Absolute: 0 10*3/uL (ref 0.0–0.7)
Eosinophils Relative: 1 % (ref 0.0–5.0)
HCT: 40.4 % (ref 36.0–46.0)
Hemoglobin: 13.6 g/dL (ref 12.0–15.0)
Lymphocytes Relative: 41.2 % (ref 12.0–46.0)
Lymphs Abs: 2 10*3/uL (ref 0.7–4.0)
MCHC: 33.6 g/dL (ref 30.0–36.0)
MCV: 92.8 fl (ref 78.0–100.0)
Monocytes Absolute: 0.3 10*3/uL (ref 0.1–1.0)
Monocytes Relative: 6.3 % (ref 3.0–12.0)
Neutro Abs: 2.4 10*3/uL (ref 1.4–7.7)
Neutrophils Relative %: 50.9 % (ref 43.0–77.0)
Platelets: 279 10*3/uL (ref 150.0–400.0)
RBC: 4.35 Mil/uL (ref 3.87–5.11)
RDW: 13.3 % (ref 11.5–15.5)
WBC: 4.8 10*3/uL (ref 4.0–10.5)

## 2021-02-16 LAB — LIPID PANEL
Cholesterol: 186 mg/dL (ref 0–200)
HDL: 54.7 mg/dL (ref >39.00)
LDL Cholesterol: 111 mg/dL — ABNORMAL HIGH (ref 0–99)
NonHDL: 131.26
Total CHOL/HDL Ratio: 3
Triglycerides: 101 mg/dL (ref 0.0–149.0)
VLDL: 20.2 mg/dL (ref 0.0–40.0)

## 2021-02-16 LAB — HEPATIC FUNCTION PANEL
ALT: 12 U/L (ref 0–35)
AST: 13 U/L (ref 0–37)
Albumin: 4.6 g/dL (ref 3.5–5.2)
Alkaline Phosphatase: 52 U/L (ref 39–117)
Bilirubin, Direct: 0.1 mg/dL (ref 0.0–0.3)
Total Bilirubin: 0.6 mg/dL (ref 0.2–1.2)
Total Protein: 8 g/dL (ref 6.0–8.3)

## 2021-02-16 LAB — TSH: TSH: 0.95 u[IU]/mL (ref 0.35–4.50)

## 2021-02-16 LAB — VITAMIN D 25 HYDROXY (VIT D DEFICIENCY, FRACTURES): VITD: 17.13 ng/mL — ABNORMAL LOW (ref 30.00–100.00)

## 2021-02-16 NOTE — Progress Notes (Signed)
Subjective:  Patient ID: Sabrina Wilkinson, female    DOB: Jun 03, 1976  Age: 45 y.o. MRN: [id number]  CC: Annual Exam and Hypertension  This visit occurred during the SARS-CoV-2 public health emergency.  Safety protocols were in place, including screening questions prior to the visit, additional usage of staff PPE, and extensive cleaning of exam room while observing appropriate contact time as indicated for disinfecting solutions.    HPI Sabrina Wilkinson presents for a CPX and f/up -   She complains of chronic fatigue.  She is active and denies any recent episodes of chest pain, shortness of breath, diaphoresis, dizziness, lightheadedness, headache, or blurred vision.  History Sabrina Wilkinson has no past medical history on file.   She has a past surgical history that includes No past surgeries and Wisdom tooth extraction.   Her family history includes Cancer in her maternal aunt, maternal grandmother, and mother; Healthy in her daughter and son; Hypertension in her father.She reports that she has never smoked. She has never used smokeless tobacco. She reports current alcohol use of about 3.0 standard drinks of alcohol per week. She reports that she does not use drugs.  Outpatient Medications Prior to Visit  Medication Sig Dispense Refill   levonorgestrel (MIRENA) 20 MCG/DAY IUD 1 each by Intrauterine route. 1 each 0   No facility-administered medications prior to visit.    ROS Review of Systems  Constitutional:  Positive for fatigue. Negative for appetite change, chills, diaphoresis and unexpected weight change.  HENT: Negative.    Eyes: Negative.  Negative for visual disturbance.  Respiratory:  Negative for apnea, cough, shortness of breath and wheezing.   Cardiovascular:  Negative for chest pain, palpitations and leg swelling.  Gastrointestinal:  Negative for abdominal pain, constipation, diarrhea, nausea and vomiting.  Endocrine: Negative.   Genitourinary: Negative.  Negative for  difficulty urinating and hematuria.  Musculoskeletal: Negative.  Negative for arthralgias and myalgias.  Skin: Negative.   Neurological: Negative.  Negative for dizziness, weakness, light-headedness and headaches.  Hematological:  Negative for adenopathy. Does not bruise/bleed easily.  Psychiatric/Behavioral: Negative.     Objective:  BP (!) 146/82 (BP Location: Left Arm, Patient Position: Sitting, Cuff Size: Large)   Pulse 93   Temp 98.4 F (36.9 C) (Oral)   Resp 16   Ht 5\' 4"  (1.626 m)   Wt 183 lb (83 kg)   SpO2 99%   BMI 31.41 kg/m   Physical Exam Vitals reviewed.  Constitutional:      Appearance: Normal appearance.  HENT:     Nose: Nose normal.     Mouth/Throat:     Mouth: Mucous membranes are moist.  Eyes:     General: No scleral icterus.    Conjunctiva/sclera: Conjunctivae normal.  Cardiovascular:     Rate and Rhythm: Normal rate and regular rhythm.     Heart sounds: No murmur heard.    Comments: EKG- NSR, 79 bpm Flat T waves No LVH or Q waves Pulmonary:     Effort: Pulmonary effort is normal.     Breath sounds: No stridor. No wheezing, rhonchi or rales.  Abdominal:     General: Abdomen is flat. Bowel sounds are normal. There is no distension.     Palpations: Abdomen is soft. There is no fluid wave, hepatomegaly, splenomegaly or mass.     Tenderness: There is no abdominal tenderness.  Musculoskeletal:        General: Normal range of motion.     Cervical back: Neck supple.  Right lower leg: No edema.     Left lower leg: No edema.  Lymphadenopathy:     Cervical: No cervical adenopathy.  Skin:    General: Skin is warm and dry.  Neurological:     General: No focal deficit present.     Mental Status: She is alert.  Psychiatric:        Mood and Affect: Mood normal.        Behavior: Behavior normal.    Lab Results  Component Value Date   WBC 4.8 02/16/2021   HGB 13.6 02/16/2021   HCT 40.4 02/16/2021   PLT 279.0 02/16/2021   GLUCOSE 95 02/16/2021    CHOL 186 02/16/2021   TRIG 101.0 02/16/2021   HDL 54.70 02/16/2021   LDLCALC 111 (H) 02/16/2021   ALT 12 02/16/2021   AST 13 02/16/2021   NA 138 02/16/2021   K 3.7 02/16/2021   CL 102 02/16/2021   CREATININE 0.75 02/16/2021   BUN 13 02/16/2021   CO2 28 02/16/2021   TSH 0.95 02/16/2021   INR 1.01 04/05/2014     Assessment & Plan:   Sabrina Wilkinson was seen today for annual exam and hypertension.  Diagnoses and all orders for this visit:  Encounter for general adult medical examination with abnormal findings- Exam completed, labs reviewed- statin therapy is not indicated, vaccines are up-to-date, cancer screenings addressed, patient education was given. -     Lipid panel; Future -     Hepatitis C antibody; Future -     HIV Antibody (routine testing w rflx); Future -     HIV Antibody (routine testing w rflx) -     Hepatitis C antibody -     Lipid panel  Chronic fatigue- I will treat the vitamin D deficiency.  The rest of the labs are unremarkable. -     CBC with Differential/Platelet; Future -     Basic metabolic panel; Future -     TSH; Future -     VITAMIN D 25 Hydroxy (Vit-D Deficiency, Fractures); Future -     Hepatic function panel; Future -     Hepatic function panel -     VITAMIN D 25 Hydroxy (Vit-D Deficiency, Fractures) -     TSH -     Basic metabolic panel -     CBC with Differential/Platelet  Primary hypertension- She has developed stage I hypertension.  Will screen for secondary causes and endorgan damage.  She prefers not to take an antihypertensive at this time.  She will work on her lifestyle modifications for the next 3 months and will return for recheck of her blood pressure. -     CBC with Differential/Platelet; Future -     Basic metabolic panel; Future -     Aldosterone + renin activity w/ ratio; Future -     TSH; Future -     Urinalysis, Routine w reflex microscopic; Future -     VITAMIN D 25 Hydroxy (Vit-D Deficiency, Fractures); Future -     Hepatic  function panel; Future -     EKG 12-Lead -     Hepatic function panel -     VITAMIN D 25 Hydroxy (Vit-D Deficiency, Fractures) -     Urinalysis, Routine w reflex microscopic -     TSH -     Aldosterone + renin activity w/ ratio -     Basic metabolic panel -     CBC with Differential/Platelet  Need for hepatitis C screening  test -     Hepatitis C antibody; Future -     Hepatitis C antibody  Vitamin D deficiency disease -     Cholecalciferol 1.25 MG (50000 UT) capsule; Take 1 capsule (50,000 Units total) by mouth once a week.  Screen for colon cancer- Will order cologuard.  I am having Sabrina C. Bennett "Missy" start on Cholecalciferol. I am also having her maintain her levonorgestrel.  Meds ordered this encounter  Medications   Cholecalciferol 1.25 MG (50000 UT) capsule    Sig: Take 1 capsule (50,000 Units total) by mouth once a week.    Dispense:  12 capsule    Refill:  1      Follow-up: Return in about 3 months (around 05/19/2021).  Sanda Linger, MD

## 2021-02-16 NOTE — Patient Instructions (Signed)

## 2021-02-18 DIAGNOSIS — Z1211 Encounter for screening for malignant neoplasm of colon: Secondary | ICD-10-CM | POA: Insufficient documentation

## 2021-02-18 DIAGNOSIS — E559 Vitamin D deficiency, unspecified: Secondary | ICD-10-CM | POA: Insufficient documentation

## 2021-02-18 MED ORDER — CHOLECALCIFEROL 1.25 MG (50000 UT) PO CAPS
50000.0000 [IU] | ORAL_CAPSULE | ORAL | 1 refills | Status: DC
Start: 2021-02-18 — End: 2023-10-01

## 2021-02-20 NOTE — Addendum Note (Signed)
Addended by: Darryll Capers on: 02/20/2021 08:28 AM   Modules accepted: Orders

## 2021-02-23 LAB — HIV ANTIBODY (ROUTINE TESTING W REFLEX): HIV 1&2 Ab, 4th Generation: NONREACTIVE

## 2021-02-23 LAB — HEPATITIS C ANTIBODY
Hepatitis C Ab: NONREACTIVE
SIGNAL TO CUT-OFF: 0.01 (ref <1.00)

## 2021-02-23 LAB — ALDOSTERONE + RENIN ACTIVITY W/ RATIO
ALDO / PRA Ratio: 12.8 Ratio (ref 0.9–28.9)
Aldosterone: 5 ng/dL
Renin Activity: 0.39 ng/mL/h (ref 0.25–5.82)

## 2021-05-17 LAB — COLOGUARD: Cologuard: NEGATIVE

## 2021-05-23 ENCOUNTER — Ambulatory Visit: Payer: 59 | Admitting: Internal Medicine

## 2021-06-14 ENCOUNTER — Encounter: Payer: Self-pay | Admitting: Internal Medicine

## 2021-06-28 ENCOUNTER — Ambulatory Visit: Payer: 59 | Admitting: Internal Medicine

## 2022-02-07 ENCOUNTER — Telehealth: Payer: 59 | Admitting: Family Medicine

## 2022-02-07 DIAGNOSIS — R21 Rash and other nonspecific skin eruption: Secondary | ICD-10-CM

## 2022-02-07 MED ORDER — PREDNISONE 10 MG (21) PO TBPK: ORAL_TABLET | ORAL | 0 refills | Status: DC

## 2022-02-07 NOTE — Progress Notes (Signed)
E Visit for Rash  We are sorry that you are not feeling well. Here is how we plan to help!  Prednisone 10 mg daily for 6 days (see taper instructions below)   HOME CARE:  Take cool showers and avoid direct sunlight. Apply cool compress or wet dressings. Take a bath in an oatmeal bath.  Sprinkle content of one Aveeno packet under running faucet with comfortably warm water.  Bathe for 15-20 minutes, 1-2 times daily.  Pat dry with a towel. Do not rub the rash. Use hydrocortisone cream. Take an antihistamine like Benadryl for widespread rashes that itch.  The adult dose of Benadryl is 25-50 mg by mouth 4 times daily. Caution:  This type of medication may cause sleepiness.  Do not drink alcohol, drive, or operate dangerous machinery while taking antihistamines.  Do not take these medications if you have prostate enlargement.  Read package instructions thoroughly on all medications that you take.  GET HELP RIGHT AWAY IF:  Symptoms don't go away after treatment. Severe itching that persists. If you rash spreads or swells. If you rash begins to smell. If it blisters and opens or develops a yellow-brown crust. You develop a fever. You have a sore throat. You become short of breath.  MAKE SURE YOU:  Understand these instructions. Will watch your condition. Will get help right away if you are not doing well or get worse.  Thank you for choosing an e-visit.  Your e-visit answers were reviewed by a board certified advanced clinical practitioner to complete your personal care plan. Depending upon the condition, your plan could have included both over the counter or prescription medications.  Please review your pharmacy choice. Make sure the pharmacy is open so you can pick up prescription now. If there is a problem, you may contact your provider through MyChart messaging and have the prescription routed to another pharmacy.  Your safety is important to us. If you have drug allergies check your  prescription carefully.   For the next 24 hours you can use MyChart to ask questions about today's visit, request a non-urgent call back, or ask for a work or school excuse. You will get an email in the next two days asking about your experience. I hope that your e-visit has been valuable and will speed your recovery.  I provided 5 minutes of non face-to-face time during this encounter for chart review, medication and order placement, as well as and documentation.   

## 2023-10-01 ENCOUNTER — Other Ambulatory Visit: Payer: Self-pay

## 2023-10-01 ENCOUNTER — Encounter: Payer: Self-pay | Admitting: Allergy & Immunology

## 2023-10-01 ENCOUNTER — Ambulatory Visit (INDEPENDENT_AMBULATORY_CARE_PROVIDER_SITE_OTHER): Payer: 59 | Admitting: Allergy & Immunology

## 2023-10-01 VITALS — BP 138/88 | HR 79 | Temp 98.4°F | Resp 18 | Ht 64.17 in | Wt 182.6 lb

## 2023-10-01 DIAGNOSIS — L508 Other urticaria: Secondary | ICD-10-CM | POA: Diagnosis not present

## 2023-10-01 DIAGNOSIS — R21 Rash and other nonspecific skin eruption: Secondary | ICD-10-CM

## 2023-10-01 NOTE — Progress Notes (Unsigned)
NEW PATIENT  Date of Service/Encounter:  10/01/23  Consult requested by: Sabrina Grandchild, MD   Assessment:   No diagnosis found.  Plan/Recommendations:   Assessment and Plan              There are no Patient Instructions on file for this visit.   {Blank single:19197::"This note in its entirety was forwarded to the Provider who requested this consultation."}  Subjective:   Sabrina Wilkinson is a 48 y.o. female presenting today for evaluation of  Chief Complaint  Patient presents with   Rash    Sabrina Wilkinson has a history of the following: Patient Active Problem List   Diagnosis Date Noted   Vitamin D deficiency disease 02/18/2021   Screen for colon cancer 02/18/2021   Encounter for general adult medical examination with abnormal findings 02/16/2021   Chronic fatigue 02/16/2021   Primary hypertension 02/16/2021   Need for hepatitis C screening test 02/16/2021    History obtained from: chart review and {Persons; PED relatives w/patient:19415::"patient"}.  Discussed the use of AI scribe software for clinical note transcription with the patient and/or guardian, who gave verbal consent to proceed.  Sabrina Wilkinson was referred by Sabrina Grandchild, MD.     Sabrina Wilkinson is a 48 y.o. female presenting for {Blank single:19197::"a food challenge","a drug challenge","skin testing","a sick visit","an evaluation of ***","a follow up visit"}.  Discussed the use of AI scribe software for clinical note transcription with the patient, who gave verbal consent to proceed.  History of Present Illness             Asthma/Respiratory Symptom History: ***  Allergic Rhinitis Symptom History: ***  Food Allergy Symptom History: ***  Skin Symptom History: ***  GERD Symptom History: ***  Infection Symptom History: ***  ***Otherwise, there is no history of other atopic diseases, including {Blank multiple:19196:o:"asthma","food allergies","drug  allergies","environmental allergies","stinging insect allergies","eczema","urticaria","contact dermatitis"}. There is no significant infectious history. ***Vaccinations are up to date.    Past Medical History: Patient Active Problem List   Diagnosis Date Noted   Vitamin D deficiency disease 02/18/2021   Screen for colon cancer 02/18/2021   Encounter for general adult medical examination with abnormal findings 02/16/2021   Chronic fatigue 02/16/2021   Primary hypertension 02/16/2021   Need for hepatitis C screening test 02/16/2021    Medication List:  Allergies as of 10/01/2023   No Known Allergies      Medication List        Accurate as of October 01, 2023  2:11 PM. If you have any questions, ask your nurse or doctor.          Cholecalciferol 1.25 MG (50000 UT) capsule Take 1 capsule (50,000 Units total) by mouth once a week.   levonorgestrel 20 MCG/DAY Iud Commonly known as: MIRENA 1 each by Intrauterine route.   predniSONE 10 MG (21) Tbpk tablet Commonly known as: STERAPRED UNI-PAK 21 TAB Take as directed        Birth History: {Blank single:19197::"non-contributory","born premature and spent time in the NICU","born at term without complications"}  Developmental History: Sabrina Wilkinson has met all milestones on time. She has required no {Blank multiple:19196:a:"speech therapy","occupational therapy","physical therapy"}. ***non-contributory  Past Surgical History: Past Surgical History:  Procedure Laterality Date   NO PAST SURGERIES     WISDOM TOOTH EXTRACTION       Family History: Family History  Problem Relation Age of Onset   Cancer Mother        Breast  Hypertension Father    Healthy Daughter    Healthy Son    Cancer Maternal Aunt    Cancer Maternal Grandmother      Social History: Sabrina Wilkinson lives at home with ***.    Review of systems otherwise negative other than that mentioned in the HPI.    Objective:   Blood pressure 138/88, pulse 79,  temperature 98.4 F (36.9 C), temperature source Temporal, resp. rate 18, height 5' 4.17" (1.63 m), weight 182 lb 9.6 oz (82.8 kg), SpO2 100%. Body mass index is 31.17 kg/m.     Physical Exam   Diagnostic studies: {Blank single:19197::"none","deferred due to recent antihistamine use","deferred due to insurance stipulations that require a separate visit for testing","labs sent instead"," "}  Spirometry: {Blank single:19197::"results normal (FEV1: ***%, FVC: ***%, FEV1/FVC: ***%)","results abnormal (FEV1: ***%, FVC: ***%, FEV1/FVC: ***%)"}.    {Blank single:19197::"Spirometry consistent with mild obstructive disease","Spirometry consistent with moderate obstructive disease","Spirometry consistent with severe obstructive disease","Spirometry consistent with possible restrictive disease","Spirometry consistent with mixed obstructive and restrictive disease","Spirometry uninterpretable due to technique","Spirometry consistent with normal pattern"}. {Blank single:19197::"Albuterol/Atrovent nebulizer","Xopenex/Atrovent nebulizer","Albuterol nebulizer","Albuterol four puffs via MDI","Xopenex four puffs via MDI"} treatment given in clinic with {Blank single:19197::"significant improvement in FEV1 per ATS criteria","significant improvement in FVC per ATS criteria","significant improvement in FEV1 and FVC per ATS criteria","improvement in FEV1, but not significant per ATS criteria","improvement in FVC, but not significant per ATS criteria","improvement in FEV1 and FVC, but not significant per ATS criteria","no improvement"}.  Allergy Studies: {Blank single:19197::"none","deferred due to recent antihistamine use","deferred due to insurance stipulations that require a separate visit for testing","labs sent instead"," "}    {Blank single:19197::"Allergy testing results were read and interpreted by myself, documented by clinical staff."," "}         Malachi Bonds, MD [facility]

## 2023-10-01 NOTE — Patient Instructions (Addendum)
1. Rash - Because of insurance stipulations, we cannot do skin testing on the same day as your first visit. - We are all working to fight this, but for now we need to do two separate visits.  - We will know more after we do testing at the next visit.  - The skin testing visit can be squeezed in at your convenience.  - Then we can make a more full plan to address all of your symptoms. - Be sure to stop your antihistamines for 3 days before this appointment.   2. Acute urticaria - We will do testing to the most common foods to rule out more than 95% of all food allergies.  - We can do selected other foods if needed (copy of food testing sheet provided).   3. Return in about 1 week (around 10/08/2023). You can have the follow up appointment with Dr. Dellis Anes or a Nurse Practicioner (our Nurse Practitioners are excellent and always have Physician oversight!).    Please inform us of any Emergency Department visits, hospitalizations, or changes in symptoms. Call us before going to the ED for breathing or allergy symptoms since we might be able to fit you in for a sick visit. Feel free to contact us anytime with any questions, problems, or concerns.  It was a pleasure to meet you today!  Websites that have reliable patient information: 1. American Academy of Asthma, Allergy, and Immunology: www.aaaai.org 2. Food Allergy Research and Education (FARE): foodallergy.org 3. Mothers of Asthmatics: http://www.asthmacommunitynetwork.org 4. American College of Allergy, Asthma, and Immunology: www.acaai.org      "Like" [place] on Facebook and Instagram for our latest updates!      A healthy democracy works best when [employer] participate! Make sure you are registered to vote! If you have moved or changed any of your contact information, you will need to get this updated before voting! Scan the QR codes below to learn more!

## 2023-10-02 ENCOUNTER — Encounter: Payer: Self-pay | Admitting: Allergy & Immunology

## 2023-10-08 ENCOUNTER — Ambulatory Visit (INDEPENDENT_AMBULATORY_CARE_PROVIDER_SITE_OTHER): Payer: 59 | Admitting: Allergy & Immunology

## 2023-10-08 DIAGNOSIS — J302 Other seasonal allergic rhinitis: Secondary | ICD-10-CM

## 2023-10-08 DIAGNOSIS — J3089 Other allergic rhinitis: Secondary | ICD-10-CM | POA: Diagnosis not present

## 2023-10-08 MED ORDER — TRIAMCINOLONE ACETONIDE 0.1 % EX LOTN: 1.0000 | TOPICAL_LOTION | Freq: Two times a day (BID) | CUTANEOUS | 4 refills | Status: AC

## 2023-10-08 NOTE — Progress Notes (Signed)
 FOLLOW UP  Date of Service/Encounter:  10/08/23   Assessment:   Rash   Acute urticaria  Perennial and seasonal allergic rhinitis (grasses, ragweed, weeds, trees, dust mites, cat, and cockroach)  Plan/Recommendations:   1. Rash - I have no idea what this is. - I would not get any more labs than what they obtained already. - Let's start triamcinolone  0.1% cream twice daily to the affected area to see if that makes a difference.  - Do not use this on the face.   2. Acute urticaria - Testing today showed: grasses, ragweed, weeds, trees, dust mites, cat, and cockroach - Testing to the most common foods was completely negative, which rules out more than 95% of all food allergies.  - Copy of test results provided.  - Avoidance measures provided. - I hate to start something for an issue you are not having, so I am going to just not start any allergy  medications at this point. - You are not having the runny nose, sneezing, congestion, etc which would be expected with the environmental allergies.   3. Return in about 3 months (around 01/05/2024). You can have the follow up appointment with Dr. Iva or a Nurse Practicioner (our Nurse Practitioners are excellent and always have Physician oversight!).   Subjective:   Sabrina Wilkinson is a 48 y.o. female presenting today for follow up of  Chief Complaint  Patient presents with   Allergy  Testing    Sabrina Wilkinson has a history of the following: Patient Active Problem List   Diagnosis Date Noted   Vitamin D  deficiency disease 02/18/2021   Screen for colon cancer 02/18/2021   Encounter for general adult medical examination with abnormal findings 02/16/2021   Chronic fatigue 02/16/2021   Primary hypertension 02/16/2021   Need for hepatitis C screening test 02/16/2021    History obtained from: chart review and patient.  Discussed the use of AI scribe software for clinical note transcription with the patient and/or  guardian, who gave verbal consent to proceed.  Sabrina Wilkinson is a 48 y.o. female presenting for skin testing. She was last seen on January 28. We could not do testing because her insurance company does not cover testing on the same day as a New Patient visit. She has been off of all antihistamines 3 days in anticipation of the testing.   At the time, she was experiencing a rash as well as acute urticaria.  She had tried steroids.  She had seen dermatologist who did some labs, although she did not know which labs were done. She did not have a biopsy done.   Rheumatoid factor was negative, ANA was negative, ESR was 8.  ASO antibody was negative, CRP was 4.  TUBES hepatitis panel was negative.  Thyroid  studies were normal.  CBC was normal.  Differential was normal.  Metabolic panel was normal.  ANCA panel was negative.  Cryoglobulin panel was not performed.   She eats red meat rarely. Alpha gal was not performed.   We did not do labs at the last visit.  Review of the chart shows that she has not had labs since May 2024 and it was just routine labs at that time.  Everything was normal then. Absolute eosinophil count was 100.    Otherwise, there have been no changes to her past medical history, surgical history, family history, or social history.    Review of systems otherwise negative other than that mentioned in the HPI.    Objective:  There were no vitals taken for this visit. There is no height or weight on file to calculate BMI.    Physical exam deferred since this was a skin testing appointment only.   Diagnostic studies:   Allergy  Studies:     Airborne Adult Perc - 10/08/23 1725     Time Antigen Placed 1715    Allergen Manufacturer Jestine    Location Back    Number of Test 55    Panel 1 Select    1. Control-Buffer 50% Glycerol Negative    2. Control-Histamine 2+    3. Bahia Negative    4. Bermuda Negative    5. Johnson Negative    6. [place]  Blue Negative    7. Meadow  Fescue 4+    8. Perennial Rye 3+    9. Timothy 3+    10. Ragweed Mix Negative    11. Cocklebur Negative    12. Plantain,  English Negative    13. Baccharis Negative    14. Dog Fennel Negative    15. Russian Thistle Negative    16. Lamb's Quarters Negative    17. Sheep Sorrell Negative    18. Rough Pigweed Negative    19. Marsh Elder, Rough Negative    20. Mugwort, Common Negative    21. Box, Elder Negative    22. Cedar, red Negative    23. Sweet Gum 3+    24. Pecan Pollen Negative    25. Pine Mix Negative    26. Walnut, Black Pollen 3+    27. Red Mulberry Negative    28. Ash Mix Negative    29. Birch Mix Negative    30. Beech American Negative    31. Cottonwood, Eastern Negative    32. Hickory, White 3+    33. Maple Mix Negative    34. Oak, Eastern Mix Negative    35. Sycamore Eastern Negative    36. Alternaria Alternata Negative    37. Cladosporium Herbarum Negative    38. Aspergillus Mix Negative    39. Penicillium Mix Negative    40. Bipolaris Sorokiniana (Helminthosporium) Negative    41. Drechslera Spicifera (Curvularia) Negative    42. Mucor Plumbeus Negative    43. Fusarium Moniliforme Negative    44. Aureobasidium Pullulans (pullulara) Negative    45. Rhizopus Oryzae Negative    46. Botrytis Cinera Negative    47. Epicoccum Nigrum Negative    48. Phoma Betae Negative    49. Dust Mite Mix Negative    50. Cat Hair 10,000 BAU/ml Negative    51.  Dog Epithelia Negative    52. Mixed Feathers Negative    53. Horse Epithelia Negative    54. Cockroach, German Negative    55. Tobacco Leaf Negative             13 Food Perc - 10/08/23 1725       Test Information   Time Antigen Placed 1715    Allergen Manufacturer Jestine    Location Back    Number of allergen test 13    Food Select      Food   1. Peanut Negative    2. Soybean Negative    3. Wheat Negative    4. Sesame Negative    5. Milk, Cow Negative    6. Casein Negative    7. Egg White, Chicken  Negative    8. Shellfish Mix Negative    9. Fish Mix Negative    10. Cashew  Negative    11. Walnut Food Negative    12. Almond Negative    13. Hazelnut Negative             Intradermal - 10/08/23 1739     Time Antigen Placed 1745    Allergen Manufacturer Jestine    Location Arm    Number of Test 14    Control Negative    Bahia 3+    Bermuda 2+    Johnson 3+    Ragweed Mix 4+    Weed Mix 4+    Mold 1 Negative    Mold 2 Negative    Mold 3 Negative    Mold 4 Negative    Mite Mix 4+    Cat 3+    Dog Negative    Cockroach 3+             Allergy  testing results were read and interpreted by myself, documented by clinical staff.      Marty Shaggy, MD  [facility] [place] 

## 2023-10-08 NOTE — Patient Instructions (Addendum)
 1. Rash - I have no idea what this is. - I would not get any more labs than what they obtained already. - Let's start triamcinolone  0.1% cream twice daily to the affected area to see if that makes a difference.  - Do not use this on the face.   2. Acute urticaria - Testing today showed: grasses, ragweed, weeds, trees, dust mites, cat, and cockroach - Testing to the most common foods was completely negative, which rules out more than 95% of all food allergies.  - Copy of test results provided.  - Avoidance measures provided. - I hate to start something for an issue you are not having, so I am going to just not start any allergy  medications at this point. - You are not having the runny nose, sneezing, congestion, etc which would be expected with the environmental allergies.   3. Return in about 3 months (around 01/05/2024). You can have the follow up appointment with Dr. Iva or a Nurse Practicioner (our Nurse Practitioners are excellent and always have Physician oversight!).    Please inform us  of any Emergency Department visits, hospitalizations, or changes in symptoms. Call us  before going to the ED for breathing or allergy  symptoms since we might be able to fit you in for a sick visit. Feel free to contact us  anytime with any questions, problems, or concerns.  It was a pleasure to meet you today!  Websites that have reliable patient information: 1. American Academy of Asthma, Allergy , and Immunology: www.aaaai.org 2. Food Allergy  Research and Education (FARE): foodallergy.org 3. Mothers of Asthmatics: http://www.asthmacommunitynetwork.org 4. American College of Allergy , Asthma, and Immunology: www.acaai.org      "Like" us  on Facebook and Instagram for our latest updates!      A healthy democracy works best when [employer] participate! Make sure you are registered to vote! If you have moved or changed any of your contact information, you will need to get this updated before  voting! Scan the QR codes below to learn more!       Airborne Adult Perc - 10/08/23 1725     Time Antigen Placed 1715    Allergen Manufacturer Jestine    Location Back    Number of Test 55    Panel 1 Select    1. Control-Buffer 50% Glycerol Negative    2. Control-Histamine 2+    3. Bahia Negative    4. Bermuda Negative    5. Johnson Negative    6. [place]  Blue Negative    7. Meadow Fescue 4+    8. Perennial Rye 3+    9. Timothy 3+    10. Ragweed Mix Negative    11. Cocklebur Negative    12. Plantain,  English Negative    13. Baccharis Negative    14. Dog Fennel Negative    15. Russian Thistle Negative    16. Lamb's Quarters Negative    17. Sheep Sorrell Negative    18. Rough Pigweed Negative    19. Marsh Elder, Rough Negative    20. Mugwort, Common Negative    21. Box, Elder Negative    22. Cedar, red Negative    23. Sweet Gum 3+    24. Pecan Pollen Negative    25. Pine Mix Negative    26. Walnut, Black Pollen 3+    27. Red Mulberry Negative    28. Ash Mix Negative    29. Birch Mix Negative    30. [place] American Negative  31. Cottonwood, Eastern Negative    32. Hickory, White 3+    33. Maple Mix Negative    34. Oak, Eastern Mix Negative    35. Sycamore Eastern Negative    36. Alternaria Alternata Negative    37. Cladosporium Herbarum Negative    38. Aspergillus Mix Negative    39. Penicillium Mix Negative    40. Bipolaris Sorokiniana (Helminthosporium) Negative    41. Drechslera Spicifera (Curvularia) Negative    42. Mucor Plumbeus Negative    43. Fusarium Moniliforme Negative    44. Aureobasidium Pullulans (pullulara) Negative    45. Rhizopus Oryzae Negative    46. Botrytis Cinera Negative    47. Epicoccum Nigrum Negative    48. Phoma Betae Negative    49. Dust Mite Mix Negative    50. Cat Hair 10,000 BAU/ml Negative    51.  Dog Epithelia Negative    52. Mixed Feathers Negative    53. Horse Epithelia Negative    54. Cockroach, German Negative    55.  Tobacco Leaf Negative             13 Food Perc - 10/08/23 1725       Test Information   Time Antigen Placed 1715    Allergen Manufacturer Jestine    Location Back    Number of allergen test 13    Food Select      Food   1. Peanut Negative    2. Soybean Negative    3. Wheat Negative    4. Sesame Negative    5. Milk, Cow Negative    6. Casein Negative    7. Egg White, Chicken Negative    8. Shellfish Mix Negative    9. Fish Mix Negative    10. Cashew Negative    11. Walnut Food Negative    12. Almond Negative    13. Hazelnut Negative             Intradermal - 10/08/23 1739     Time Antigen Placed 1745    Allergen Manufacturer Jestine    Location Arm    Number of Test 14    Control Negative    Bahia 3+    Bermuda 2+    Johnson 3+    Ragweed Mix 4+    Weed Mix 4+    Mold 1 Negative    Mold 2 Negative    Mold 3 Negative    Mold 4 Negative    Mite Mix 4+    Cat 3+    Dog Negative    Cockroach 3+             Reducing Pollen Exposure  The American Academy of Allergy , Asthma and Immunology suggests the following steps to reduce your exposure to pollen during allergy  seasons.    Do not hang sheets or clothing out to dry; pollen may collect on these items. Do not mow lawns or spend time around freshly cut grass; mowing stirs up pollen. Keep windows closed at night.  Keep car windows closed while driving. Minimize morning activities outdoors, a time when pollen counts are usually at their highest. Stay indoors as much as possible when pollen counts or humidity is high and on windy days when pollen tends to remain in the air longer. Use air conditioning when possible.  Many air conditioners have filters that trap the pollen spores. Use a HEPA room air filter to remove pollen form the indoor air you breathe.  Control  of Dog or Cat Allergen  Avoidance is the best way to manage a dog or cat allergy . If you have a dog or cat and are allergic to dog or cats,  consider removing the dog or cat from the home. If you have a dog or cat but don't want to find it a new home, or if your family wants a pet even though someone in the household is allergic, here are some strategies that may help keep symptoms at bay:  Keep the pet out of your bedroom and restrict it to only a few rooms. Be advised that keeping the dog or cat in only one room will not limit the allergens to that room. Don't pet, hug or kiss the dog or cat; if you do, wash your hands with soap and water. High-efficiency particulate air (HEPA) cleaners run continuously in a bedroom or living room can reduce allergen levels over time. Regular use of a high-efficiency vacuum cleaner or a central vacuum can reduce allergen levels. Giving your dog or cat a bath at least once a week can reduce airborne allergen.  Control of Dust Mite Allergen    Dust mites play a major role in allergic asthma and rhinitis.  They occur in environments with high humidity wherever human skin is found.  Dust mites absorb humidity from the atmosphere (ie, they do not drink) and feed on organic matter (including shed human and animal skin).  Dust mites are a microscopic type of insect that you cannot see with the naked eye.  High levels of dust mites have been detected from mattresses, pillows, carpets, upholstered furniture, bed covers, clothes, soft toys and any woven material.  The principal allergen of the dust mite is found in its feces.  A gram of dust may contain 1,000 mites and 250,000 fecal particles.  Mite antigen is easily measured in the air during house cleaning activities.  Dust mites do not bite and do not cause harm to humans, other than by triggering allergies/asthma.    Ways to decrease your exposure to dust mites in your home:  Encase mattresses, box springs and pillows with a mite-impermeable barrier or cover   Wash sheets, blankets and drapes weekly in hot water (130 F) with detergent and dry them in a  dryer on the hot setting.  Have the room cleaned frequently with a vacuum cleaner and a damp dust-mop.  For carpeting or rugs, vacuuming with a vacuum cleaner equipped with a high-efficiency particulate air (HEPA) filter.  The dust mite allergic individual should not be in a room which is being cleaned and should wait 1 hour after cleaning before going into the room. Do not sleep on upholstered furniture (eg, couches).   If possible removing carpeting, upholstered furniture and drapery from the home is ideal.  Horizontal blinds should be eliminated in the rooms where the person spends the most time (bedroom, study, television room).  Washable vinyl, roller-type shades are optimal. Remove all non-washable stuffed toys from the bedroom.  Wash stuffed toys weekly like sheets and blankets above.   Reduce indoor humidity to less than 50%.  Inexpensive humidity monitors can be purchased at most hardware stores.  Do not use a humidifier as can make the problem worse and are not recommended.    Control of Cockroach Allergen  Cockroach allergen has been identified as an important cause of acute attacks of asthma, especially in urban settings.  There are fifty-five species of cockroach that exist in the [place] ,  however only three, the American, German and Oriental species produce allergen that can affect patients with Asthma.  Allergens can be obtained from fecal particles, egg casings and secretions from cockroaches.    Remove food sources. Reduce access to water. Seal access and entry points. Spray runways with 0.5-1% Diazinon or Chlorpyrifos Blow boric acid power under stoves and refrigerator. Place bait stations (hydramethylnon) at feeding sites.

## 2023-10-09 ENCOUNTER — Encounter: Payer: Self-pay | Admitting: Allergy & Immunology

## 2024-06-01 ENCOUNTER — Other Ambulatory Visit: Payer: Self-pay | Admitting: Medical Genetics
